# Patient Record
Sex: Male | Born: 1950 | Race: White | Hispanic: No | Marital: Married | Smoking: Current every day smoker
Health system: Southern US, Community
[De-identification: ages and names within clinical notes are randomized; demographics above are authoritative.]

## PROBLEM LIST (undated history)

## (undated) DIAGNOSIS — I10 Essential (primary) hypertension: Secondary | ICD-10-CM

## (undated) DIAGNOSIS — K859 Acute pancreatitis without necrosis or infection, unspecified: Secondary | ICD-10-CM

## (undated) DIAGNOSIS — R55 Syncope and collapse: Secondary | ICD-10-CM

## (undated) DIAGNOSIS — I480 Paroxysmal atrial fibrillation: Secondary | ICD-10-CM

## (undated) DIAGNOSIS — E785 Hyperlipidemia, unspecified: Secondary | ICD-10-CM

## (undated) HISTORY — DX: Hyperlipidemia, unspecified: E78.5

## (undated) HISTORY — PX: RADIOFREQUENCY ABLATION: SHX2290

## (undated) HISTORY — DX: Syncope and collapse: R55

## (undated) HISTORY — DX: Essential (primary) hypertension: I10

## (undated) HISTORY — DX: Paroxysmal atrial fibrillation: I48.0

---

## 1999-06-07 HISTORY — PX: PACEMAKER INSERTION: SHX728

## 1999-06-16 ENCOUNTER — Inpatient Hospital Stay (HOSPITAL_COMMUNITY): Admission: EM | Admit: 1999-06-16 | Discharge: 1999-06-17 | Payer: Self-pay | Admitting: Cardiology

## 1999-06-16 ENCOUNTER — Encounter: Payer: Self-pay | Admitting: Cardiology

## 1999-06-17 ENCOUNTER — Encounter: Payer: Self-pay | Admitting: Cardiology

## 1999-07-23 ENCOUNTER — Inpatient Hospital Stay (HOSPITAL_COMMUNITY): Admission: EM | Admit: 1999-07-23 | Discharge: 1999-07-27 | Payer: Self-pay | Admitting: Cardiology

## 1999-07-27 ENCOUNTER — Encounter: Payer: Self-pay | Admitting: Cardiology

## 2004-04-19 ENCOUNTER — Ambulatory Visit: Payer: Self-pay | Admitting: *Deleted

## 2004-05-05 ENCOUNTER — Ambulatory Visit: Payer: Self-pay

## 2004-09-06 ENCOUNTER — Ambulatory Visit: Payer: Self-pay | Admitting: *Deleted

## 2004-09-22 ENCOUNTER — Ambulatory Visit: Payer: Self-pay | Admitting: *Deleted

## 2004-10-20 ENCOUNTER — Ambulatory Visit: Payer: Self-pay | Admitting: *Deleted

## 2004-11-18 ENCOUNTER — Ambulatory Visit: Payer: Self-pay | Admitting: *Deleted

## 2004-12-29 ENCOUNTER — Ambulatory Visit: Payer: Self-pay | Admitting: *Deleted

## 2005-01-27 ENCOUNTER — Ambulatory Visit: Payer: Self-pay | Admitting: *Deleted

## 2005-02-11 ENCOUNTER — Ambulatory Visit: Payer: Self-pay | Admitting: *Deleted

## 2005-03-11 ENCOUNTER — Ambulatory Visit: Payer: Self-pay | Admitting: *Deleted

## 2005-03-28 ENCOUNTER — Ambulatory Visit (HOSPITAL_COMMUNITY): Admission: RE | Admit: 2005-03-28 | Discharge: 2005-03-28 | Payer: Self-pay | Admitting: Internal Medicine

## 2005-05-18 ENCOUNTER — Ambulatory Visit: Payer: Self-pay | Admitting: Internal Medicine

## 2005-05-25 ENCOUNTER — Ambulatory Visit: Payer: Self-pay | Admitting: Cardiology

## 2005-06-03 ENCOUNTER — Ambulatory Visit: Payer: Self-pay | Admitting: Cardiology

## 2005-06-09 ENCOUNTER — Ambulatory Visit: Payer: Self-pay | Admitting: Cardiology

## 2005-06-22 ENCOUNTER — Ambulatory Visit: Payer: Self-pay | Admitting: Internal Medicine

## 2005-06-22 ENCOUNTER — Ambulatory Visit (HOSPITAL_COMMUNITY): Admission: RE | Admit: 2005-06-22 | Discharge: 2005-06-23 | Payer: Self-pay | Admitting: Internal Medicine

## 2005-06-27 ENCOUNTER — Ambulatory Visit: Payer: Self-pay | Admitting: *Deleted

## 2005-06-29 ENCOUNTER — Ambulatory Visit: Payer: Self-pay | Admitting: *Deleted

## 2005-07-01 ENCOUNTER — Ambulatory Visit: Payer: Self-pay | Admitting: *Deleted

## 2005-07-18 ENCOUNTER — Ambulatory Visit: Payer: Self-pay | Admitting: *Deleted

## 2005-08-02 ENCOUNTER — Ambulatory Visit: Payer: Self-pay | Admitting: Internal Medicine

## 2005-08-30 ENCOUNTER — Ambulatory Visit: Payer: Self-pay | Admitting: Internal Medicine

## 2005-09-29 ENCOUNTER — Ambulatory Visit: Payer: Self-pay | Admitting: Internal Medicine

## 2005-11-02 ENCOUNTER — Ambulatory Visit: Payer: Self-pay | Admitting: Internal Medicine

## 2006-01-09 ENCOUNTER — Ambulatory Visit: Payer: Self-pay | Admitting: Internal Medicine

## 2006-02-03 ENCOUNTER — Ambulatory Visit: Payer: Self-pay | Admitting: Internal Medicine

## 2006-03-07 ENCOUNTER — Ambulatory Visit: Payer: Self-pay | Admitting: Internal Medicine

## 2006-04-04 ENCOUNTER — Ambulatory Visit: Payer: Self-pay | Admitting: Internal Medicine

## 2006-05-02 ENCOUNTER — Ambulatory Visit: Payer: Self-pay | Admitting: Internal Medicine

## 2006-06-02 ENCOUNTER — Ambulatory Visit: Payer: Self-pay | Admitting: Internal Medicine

## 2006-06-27 ENCOUNTER — Ambulatory Visit: Payer: Self-pay | Admitting: Internal Medicine

## 2006-08-22 ENCOUNTER — Ambulatory Visit: Payer: Self-pay | Admitting: Internal Medicine

## 2006-10-17 ENCOUNTER — Ambulatory Visit: Payer: Self-pay | Admitting: Internal Medicine

## 2006-12-19 ENCOUNTER — Ambulatory Visit: Payer: Self-pay | Admitting: Internal Medicine

## 2007-01-16 ENCOUNTER — Ambulatory Visit: Payer: Self-pay | Admitting: Internal Medicine

## 2007-02-13 ENCOUNTER — Ambulatory Visit: Payer: Self-pay | Admitting: Internal Medicine

## 2007-02-22 ENCOUNTER — Ambulatory Visit: Payer: Self-pay | Admitting: Internal Medicine

## 2007-03-13 ENCOUNTER — Ambulatory Visit: Payer: Self-pay | Admitting: Internal Medicine

## 2007-06-12 ENCOUNTER — Ambulatory Visit: Payer: Self-pay | Admitting: Internal Medicine

## 2007-07-03 ENCOUNTER — Ambulatory Visit: Payer: Self-pay | Admitting: Internal Medicine

## 2007-10-02 ENCOUNTER — Ambulatory Visit: Payer: Self-pay | Admitting: Internal Medicine

## 2008-01-01 ENCOUNTER — Ambulatory Visit: Payer: Self-pay | Admitting: Cardiology

## 2008-04-02 ENCOUNTER — Ambulatory Visit: Payer: Self-pay | Admitting: Internal Medicine

## 2008-04-09 ENCOUNTER — Ambulatory Visit: Payer: Self-pay | Admitting: Internal Medicine

## 2008-07-02 ENCOUNTER — Ambulatory Visit: Payer: Self-pay | Admitting: Internal Medicine

## 2008-09-11 ENCOUNTER — Encounter: Payer: Self-pay | Admitting: Internal Medicine

## 2008-09-25 ENCOUNTER — Ambulatory Visit: Payer: Self-pay | Admitting: Internal Medicine

## 2008-12-31 ENCOUNTER — Ambulatory Visit: Payer: Self-pay | Admitting: Internal Medicine

## 2009-04-01 ENCOUNTER — Ambulatory Visit: Payer: Self-pay | Admitting: Internal Medicine

## 2009-04-29 DIAGNOSIS — E785 Hyperlipidemia, unspecified: Secondary | ICD-10-CM

## 2009-04-29 DIAGNOSIS — E119 Type 2 diabetes mellitus without complications: Secondary | ICD-10-CM | POA: Insufficient documentation

## 2009-04-29 DIAGNOSIS — I4892 Unspecified atrial flutter: Secondary | ICD-10-CM

## 2009-04-29 DIAGNOSIS — I10 Essential (primary) hypertension: Secondary | ICD-10-CM

## 2009-04-29 DIAGNOSIS — R55 Syncope and collapse: Secondary | ICD-10-CM | POA: Insufficient documentation

## 2009-05-04 ENCOUNTER — Encounter (INDEPENDENT_AMBULATORY_CARE_PROVIDER_SITE_OTHER): Payer: Self-pay | Admitting: *Deleted

## 2009-05-04 ENCOUNTER — Ambulatory Visit: Payer: Self-pay | Admitting: Internal Medicine

## 2009-05-04 DIAGNOSIS — Z950 Presence of cardiac pacemaker: Secondary | ICD-10-CM

## 2009-05-06 ENCOUNTER — Ambulatory Visit: Payer: Self-pay | Admitting: Cardiology

## 2009-05-06 ENCOUNTER — Ambulatory Visit (HOSPITAL_COMMUNITY): Admission: RE | Admit: 2009-05-06 | Discharge: 2009-05-06 | Payer: Self-pay | Admitting: Internal Medicine

## 2009-05-06 ENCOUNTER — Encounter: Payer: Self-pay | Admitting: Internal Medicine

## 2009-05-06 ENCOUNTER — Encounter (INDEPENDENT_AMBULATORY_CARE_PROVIDER_SITE_OTHER): Payer: Self-pay

## 2009-05-06 LAB — CONVERTED CEMR LAB
BUN: 26 mg/dL — ABNORMAL HIGH (ref 6–23)
INR: 1.17 (ref <1.50)
Potassium: 3.4 meq/L — ABNORMAL LOW (ref 3.5–5.3)
Prothrombin Time: 14.8 s (ref 11.6–15.2)
Sodium: 139 meq/L (ref 135–145)

## 2009-05-07 ENCOUNTER — Encounter: Payer: Self-pay | Admitting: Cardiology

## 2009-05-07 LAB — CONVERTED CEMR LAB
Eosinophils Absolute: 0.1 10*3/uL (ref 0.0–0.7)
Lymphs Abs: 2.5 10*3/uL (ref 0.7–4.0)
MCV: 88.5 fL (ref 78.0–100.0)
Monocytes Relative: 11 % (ref 3–12)
Neutrophils Relative %: 57 % (ref 43–77)
RBC: 4.7 M/uL (ref 4.22–5.81)
WBC: 8 10*3/uL (ref 4.0–10.5)
aPTT: 28 s (ref 24–37)

## 2009-05-08 ENCOUNTER — Ambulatory Visit (HOSPITAL_COMMUNITY): Admission: RE | Admit: 2009-05-08 | Discharge: 2009-05-08 | Payer: Self-pay | Admitting: Internal Medicine

## 2009-05-08 ENCOUNTER — Ambulatory Visit: Payer: Self-pay | Admitting: Internal Medicine

## 2009-05-22 ENCOUNTER — Encounter: Payer: Self-pay | Admitting: Internal Medicine

## 2009-05-25 ENCOUNTER — Encounter: Payer: Self-pay | Admitting: Internal Medicine

## 2009-05-25 ENCOUNTER — Ambulatory Visit: Payer: Self-pay | Admitting: Cardiology

## 2009-08-03 ENCOUNTER — Ambulatory Visit: Payer: Self-pay | Admitting: Internal Medicine

## 2009-08-12 ENCOUNTER — Ambulatory Visit: Payer: Self-pay | Admitting: Internal Medicine

## 2010-01-04 ENCOUNTER — Ambulatory Visit (HOSPITAL_COMMUNITY): Admission: RE | Admit: 2010-01-04 | Discharge: 2010-01-04 | Payer: Self-pay | Admitting: Internal Medicine

## 2010-03-03 ENCOUNTER — Ambulatory Visit (HOSPITAL_COMMUNITY)
Admission: RE | Admit: 2010-03-03 | Discharge: 2010-03-03 | Payer: Self-pay | Source: Home / Self Care | Admitting: Internal Medicine

## 2010-05-14 ENCOUNTER — Encounter (INDEPENDENT_AMBULATORY_CARE_PROVIDER_SITE_OTHER): Payer: Self-pay | Admitting: *Deleted

## 2010-05-18 ENCOUNTER — Ambulatory Visit: Payer: Self-pay | Admitting: Internal Medicine

## 2010-05-21 ENCOUNTER — Ambulatory Visit (HOSPITAL_COMMUNITY)
Admission: RE | Admit: 2010-05-21 | Discharge: 2010-05-21 | Payer: Self-pay | Source: Home / Self Care | Attending: Internal Medicine | Admitting: Internal Medicine

## 2010-06-16 ENCOUNTER — Encounter (INDEPENDENT_AMBULATORY_CARE_PROVIDER_SITE_OTHER): Payer: Self-pay | Admitting: *Deleted

## 2010-06-26 ENCOUNTER — Encounter: Payer: Self-pay | Admitting: Internal Medicine

## 2010-07-08 NOTE — Letter (Signed)
Summary: Appointment - Reminder 2  [facility] [employer], Main Office  [address]   [place], [place] [address]   Phone: [phone]  Fax: [phone]     June 16, 2010 MRN: [id number]   ARSH FEUTZ [address] [place], [place]  [address]   Dear Mr. GEIMER,  Our records indicate that it is past time to schedule a follow-up appointment. Dr. Ladona Ridgel recommended that you follow up with [facility]. It is very important that we reach you to schedule this appointment. We look forward to participating in your health care needs. Please contact us at the number listed above at your earliest convenience to schedule your appointment.  If you are unable to make an appointment at this time, give [place] a call so we can update our records.     Sincerely,   Glass blower/designer

## 2010-07-08 NOTE — Assessment & Plan Note (Signed)
Summary: 3 MTH F/U PER CHECKOUT ON 05/25/09/TG      Allergies Added: NKDA  Visit Type:  Follow-up Primary Provider:  Osborne Casco  CC:  no complaints today.  History of Present Illness: Mr. Gabriel Sandoval returns today for followup.  He has a h/o CHB, and is s/p PPM.  He has a h/o atrial flutter and is s/p ablation.   He had been at [facility] with mode switch to VVI pacing.  He subsequently underwent PPM generator change and returns today for followup.  He denies peripheral edema or c/p.  No syncope. His dyspnea is much improved.  Current Medications (verified): 1)  Prinivil 20 Mg Tabs (Lisinopril) .... Take 1 Tab Daily 2)  Metoprolol Tartrate 50 Mg Tabs (Metoprolol Tartrate) .... Take 1 Tab Daily 3)  Chlorthalidone 25 Mg Tabs (Chlorthalidone) .... Take 1 Tab Daily 4)  Glipizide 10 Mg Tabs (Glipizide) .... Take 1 Tab Daily 5)  Lantus 100 Unit/ml Soln (Insulin Glargine) .... 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    Vital Signs:  Patient profile:   60 year old male Weight:      245 pounds Pulse rate:   76 / minute BP sitting:   102 / 60  (right arm)  Vitals Entered By: Dreama Saa, CNA (August 12, 2009 2:49 PM)  Physical Exam  General:  Well developed, well nourished,man in no acute distress.  HEENT: normal Neck: supple. No JVD. Carotids 2+ bilaterally no bruits Cor: RRR no rubs, gallops or murmur Lungs: CTA.  Well healed PPM incision. Ab: soft, nontender. nondistended. No HSM. Good bowel sounds Ext: warm. no cyanosis,  clubbing or edema Neuro: alert and oriented. Grossly nonfocal. affect pleasant    PPM Specifications Following MD:  Lewayne Bunting, MD     PPM Vendor:  Medtronic     PPM Model Number:  [id number]     PPM Serial Number:  [id number] PPM DOI:  05/08/2009     PPM Implanting MD:  Lewayne Bunting, MD  Lead 1    Location: AV     DOI: 06/16/1999     Model #: 1388TC     Serial #: [id number]     Status: active Lead 2    Location: RV     DOI: 06/16/1999     Model #: 1388TC     Serial #: [id number]     Status: active  Magnet Response Rate:  BOL 85 ERI  65  Indications:  complete heart block  Explantation Comments:  05/08/2009 Medtronic Sigma [id number] H explanted  PPM Follow Up Remote Check?  No Battery Voltage:  2.8 V     Battery Est. Longevity:  7.5 years     Pacer Dependent:  Yes       PPM Device Measurements Atrium  Amplitude: 2.8 mV, Impedance: 466 ohms, Threshold: 0.625 V at 0.4 msec Right Ventricle  Impedance: 384 ohms, Threshold: 1.25 V at 0.4 msec  Episodes MS Episodes:  1     Percent Mode Switch:  <0.1%     Coumadin:  No Ventricular High Rate:  1     Atrial Pacing:  23.6%  Ventricular Pacing:  99.9%  Parameters Mode:  DDDR     Lower Rate Limit:  60     Upper Rate Limit:  130 Paced AV Delay:  180     Sensed AV Delay:  150 Next Cardiology Appt Due:  02/04/2010 Tech Comments:  RV reprogrammed 2.5@0 .4.  Device function normal.  1 VHR episode 6 seconds.  ROV 12/11 with Dr. Ladona Ridgel in [facility]. Altha Harm, LPN  August 13, 7827 3:17 PM  MD Comments:  Agree with above.  Impression & Recommendations:  Problem # 1:  CARDIAC PACEMAKER IN SITU (ICD-V45.01) His device is working normally.  I will see him back for a check in 10 months.  Problem # 2:  HYPERTENSION (ICD-401.9) He notes that his blood pressure has been well controlled.  He is trying to reduce his sodium intake.  Continue current meds. His updated medication list for this problem includes:    Prinivil 20 Mg Tabs (Lisinopril) .Marland Kitchen... Take  1 tab daily    Metoprolol Tartrate 50 Mg Tabs (Metoprolol tartrate) .Marland Kitchen... Take 1 tab daily    Chlorthalidone 25 Mg Tabs (Chlorthalidone) .Marland Kitchen... Take 1 tab daily

## 2010-07-08 NOTE — Letter (Signed)
Summary: Appointment - Reminder 2  [facility] [address]   [place], [place] [address]   Phone: [phone]  Fax: [phone]     May 14, 2010 MRN: [id number]   AVIYON HOCEVAR [address] [place], [place]  [address]   Dear Mr. PRYCE,  Our records indicate that it is time to schedule a follow-up appointment.  Dr.  Ladona Ridgel       recommended that you follow up with [place] in   12.2011         . It is very important that we reach you to schedule this appointment. We look forward to participating in your health care needs. Please contact us at the number listed above at your earliest convenience to schedule your appointment.  If you are unable to make an appointment at this time, give [place] a call so we can update our records.     Sincerely,   Glass blower/designer

## 2010-07-08 NOTE — Cardiovascular Report (Signed)
Summary: Office Visit   Office Visit   Imported By: Roderic Ovens 06/11/2009 15:30:33  _____________________________________________________________________  External Attachment:    Type:   Image     Comment:   External Document

## 2010-08-02 ENCOUNTER — Ambulatory Visit (INDEPENDENT_AMBULATORY_CARE_PROVIDER_SITE_OTHER): Payer: Managed Care, Other (non HMO) | Admitting: Internal Medicine

## 2010-08-02 DIAGNOSIS — K861 Other chronic pancreatitis: Secondary | ICD-10-CM

## 2010-08-26 ENCOUNTER — Encounter (INDEPENDENT_AMBULATORY_CARE_PROVIDER_SITE_OTHER): Payer: Self-pay | Admitting: *Deleted

## 2010-09-02 NOTE — Letter (Signed)
Summary: Appointment - Reminder 2  [facility], Main Office  [address]   [place], [place] [address]   Phone: [phone]  Fax: [phone]     August 26, 2010 MRN: [id number]   Gabriel Sandoval [address] [place], [place]  [address]   Dear [name],  Our records indicate that it is past time to schedule a follow-up appointment. Dr. Ladona Ridgel recommended that you follow up with [place] in December 2011. It is very important that we reach you to schedule this appointment. We look forward to participating in your health care needs. Please contact us at the number listed above at your earliest convenience to schedule your appointment.  If you are unable to make an appointment at this time, give [place] a call so we can update our records.     Sincerely,   Glass blower/designer

## 2010-10-19 NOTE — Assessment & Plan Note (Signed)
[facility]                         ELECTROPHYSIOLOGY OFFICE NOTE   Gabriel, Sandoval                    MRN:          [id number]  DATE:04/09/2008                            DOB:          [birth year]    Gabriel Sandoval returns today for followup.  He is a very pleasant, middle-  aged male with a history of complete heart block status post pacemaker  insertion, history of hypertension which has been well controlled,  history of atrial flutter status post catheter ablation, who returns  today for followup.  In the last year, he has done well.  He is working  more than 40 hours a week.  He denies chest pain or shortness of breath.   His current medications include:  1. Lisinopril 20 a day.  2. Glipizide 5 a day.  3. Metoprolol 50 mg half a tablet twice daily.  4. Metformin 500 twice a day.   PHYSICAL EXAMINATION:  GENERAL:  He is a pleasant, well-appearing,  middle-aged man in no distress.  VITAL SIGNS:  Blood pressure today was 122/70, the pulse 74 and regular,  the respirations were 18, and weight was 265 pounds.  NECK:  No jugular venous distention.  LUNGS:  Clear bilaterally to auscultation.  No wheezes, rales, or  rhonchi are present.  CARDIAC:  Regular rate and rhythm.  Normal S1 and S2.  ABDOMEN:  Soft, nontender.  EXTREMITIES:  Trace peripheral edema bilaterally.   Interrogation of his pacemaker demonstrates a Medtronic Sigma, P-waves  were 2.8, there were no R-waves secondary to complete heart block, the  impedance 438 in the A and 405 in the V, the threshold 0.5 at 0.4 in the  atrium and 1 at 0.6 in the RV.  Battery voltage was 2.7 V.  Its  estimated longevity is approximately 1 year.  He is 44% A paced and 100%  V paced.   IMPRESSION:  1. Complete heart block.  2. Hypertension.  3. Atrial flutter status post ablation.   DISCUSSION:  Overall, Gabriel Sandoval is stable.  His pacemaker is working  normally.  He is approaching, but not  yet at [facility].  We will plan to see  the patient back in the office in 6 months for additional pacemaker  evaluation.     Doylene Canning. Ladona Ridgel, MD  Electronically Signed    GWT/MedQ  DD: 04/09/2008  DT: 04/10/2008  Job #: [id number]   cc:   Kingsley Callander. Ouida Sills, MD

## 2010-10-19 NOTE — Assessment & Plan Note (Signed)
[facility]                         ELECTROPHYSIOLOGY OFFICE NOTE   Gabriel Sandoval, Gabriel Sandoval                    MRN:          [id number]  DATE:02/22/2007                            DOB:          December 24, 1950    Gabriel Sandoval returns today for followup.  He is a very pleasant, middle-  age male with a history of multiple medical problems including complete  heart block, atrial flutter, status post ablation, history of pacemaker  insertion who returns today for followup.  He denies chest pain or  shortness of breath.  He has had no palpitations and otherwise has been  well.  He continues to work on a regular basis.  He denies smoking  cigarettes.   PHYSICAL EXAMINATION:  GENERAL:  He is a pleasant, middle-age man in no  distress.  VITAL SIGNS:  Blood pressure 150/80, pulse 75 and regular, respirations  18, weight 278 pounds.  NECK:  No jugular venous distention.  LUNGS:  Clear bilaterally to auscultation.  Now wheezes, rales or  rhonchi.  CARDIAC:  Regular rate and rhythm with normal S1, S2.  EXTREMITIES:  No edema.  ABDOMEN:  Soft and nontender.   MEDICATIONS:  1. Glipizide 5 a day.  2. Chlorthalidone 25 a day.  3. Metoprolol 25 twice daily.  4. Metformin 500 mg twice daily.   Interrogation of his pacemaker demonstrates a Medtronic Sigma with P  waves of greater than 2.  There are no R waves secondary to pacemaker  dependence.  The impedance 451 in the atrium, 419 in the ventricle.  Threshold in the ventricle 0.15.  Battery voltage was 2.72 V.  Today, we  turned his pulse width down to 0.6 in the ventricle with still a two  time safety margin.  His estimated battery longevity is 1-1/2 years.   IMPRESSION:  1. Complete heart block.  2. Status post pacemaker.  3. Hypertension.  4. Atrial flutter, status post ablation.   DISCUSSION:  Overall, Gabriel Sandoval is stable and his pacemaker is working  normally.  We will plan to see him back in the office  in 1 year.     Doylene Canning. Ladona Ridgel, MD  Electronically Signed   GWT/MedQ  DD: 02/22/2007  DT: 02/23/2007  Job #: [id number]

## 2010-10-22 NOTE — Op Note (Signed)
NAMEROBIE, Gabriel Sandoval NO.:  [id number]   MEDICAL RECORD NO.:  [id number]          PATIENT TYPE:  OIB   LOCATION:  2006                         FACILITY:  [facility]   PHYSICIAN:  Doylene Canning. Ladona Ridgel, M.D.  DATE OF BIRTH:  1951/02/20   DATE OF PROCEDURE:  06/22/2005  DATE OF DISCHARGE:                                 OPERATIVE REPORT   PROCEDURE PERFORMED:  Electrophysiology study with RF catheter ablation of  atrial flutter.   I. INTRODUCTION:  The patient is a 54-year male with a history of complete  heart block and a history of hypertension and syncope; status post permanent  pacemaker insertion back in 2001. He developed atrial flutter which has been  present since 2005. He is placed on Coumadin. He is now referred for  electrophysiologic study and catheter ablation of his atrial flutter.   II. PROCEDURE:  After informed consent was obtained,  the patient was taken  to the diagnostic CP lab in the fasting state. After the usual preparation  and draping, intravenous fentanyl and midazolam was given for sedation. A 6-  [place] hexapolar catheter was inserted percutaneously into the right jugular  vein and advanced into the coronary sinus. A 5-French quadripolar catheter  was inserted percutaneously in the right femoral vein and advanced to the  HIS bundle region. A 7-French 20-pole halo catheter was inserted  percutaneously into the right femoral vein and advanced to the right atrium.   After measurement of the basic intervals, mapping was carried out which  demonstrated typical counterclockwise tricuspid annular reentrant atrial  flutter. Two RF energy applications were delivered to the atrial flutter  isthmus resulting in the termination of atrial flutter and restoration of  sinus rhythm. Pacing was carried out in the coronary sinus. Isthmus  conduction was intermittently present. Ten RF energy applications were  delivered to the usual atrial flutter isthmus resulting  in the creation of  an isthmus block. Following this, two bonus RF energy applications were also  delivered. At this point, rapid ventricular pacing was carried out from the  RV apex at a pacing cycle length of 600 milliseconds, and stepwise decreased  down to 500 milliseconds where an AV Wenckebach was observed. During rapid  ventricular  pacing the atrial was activation was midline and decremental.   Next, programmed ventricular stimulation was carried out from the ablation  catheter in the right ventricle at a pacing cycle length of 600  milliseconds. The S1-S2 interval was stepwise decreased from 540  milliseconds down to 300 milliseconds where  the retrograde AV node RP was  observed. During programmed ventricular stimulation the atrial activation  was midline and decremental. Rapid atrial pacing was then carried out  demonstrating AV block.  Programmed atrial stimulation was also carried out  demonstrating AV block. At this point, the patient was observed for  approximately 30 minutes and had no recurrent atrial flutter isthmus  conduction, nor any inducible atrial flutter.  The catheter was then  removed; hemostasis was assured; and the patient was returned to his room in  satisfactory condition.   III. COMPLICATIONS:  There  were no immediate procedure complications.   IV. RESULTS:  A.  BASELINE ECG:  His Baseline ECG demonstrates atrial  flutter with ventricular pacing.  The QRS duration was 170 milliseconds.  B.  BASELINE INTERVALS:  Atrial flutter cycle length was 256 milliseconds.  There was no HV interval secondary to underlying completely heart block.  The QRS duration was 170 milliseconds.  C.  RAPID VENTRICULAR PACING:  Rapid ventricular pacing was carried out from  the right ventricle and step-wise decreased from 600 milliseconds down to  500 milliseconds where an AV Wenckebach was observed.  During rapid  ventricular pacing the atrial pacing was midline and  incremental.  D.  PROGRAMMED VENTRICULAR STIMULATION:  Programmed ventricular stimulation  was carried out from the right ventricular apex at a base drive cycle length  of 161 milliseconds.  The S1-S2 interval was stepwise decreased from 540  milliseconds down to 300 milliseconds where the retrograde AV node ARP was  observed.  During programmed ventricular stimulation and atrial activation  sequence was midline and decremental.  E.  RAPID ATRIAL PACING:  Rapid atrial pacing demonstrated AV Wenckebach wt  600 milliseconds.  F.  PROGRAMMED ATRIAL STIMULATION:  Programmed atrial stimulation  demonstrated an AV node ERP of greater than or equal to 600/540.  G.  ARRHYTHMIAS OBSERVED:  1.  Atrial flutter initiation present at the time of EP study. Duration was      sustained cycle length of 256 milliseconds.  Method of termination was      with RF ablation.      1.  MAPPING:  Mapping of the atrial flutter isthmus demonstrated a very          large atrial flutter isthmus with very large atrial electrograms in          the atrial flutter isthmus.          1.  RF ENERGY APPLICATION:  A total of 14 RF energy applications              were delivered. During the second RF energy application, atrial              flutter was terminated and sinus rhythm was restored. Ten              additional RF energy applications were delivered resulting in              isthmus block. Finally two bonus RF energy applications were              delivered.   V. CONCLUSION:  This study demonstrates successful electrophysiologic study  and RF catheter ablation of typical atrial flutter with a total of 14 RF  energy applications delivered to the usual atrial flutter isthmus resulting  in termination of atrial flutter, restoration of sinus rhythm, and creation  of bidirectional block in atrial flutter isthmus.           ______________________________  Doylene Canning. Ladona Ridgel, M.D.    GWT/MEDQ  D:  06/22/2005  T:   06/22/2005  Job:  [id number]   cc:   Kingsley Callander. Ouida Sills, MD  Fax: [phone]   Vida Roller, M.D.  Fax: [phone]

## 2010-10-22 NOTE — Discharge Summary (Signed)
[facility]  Patient:    Gabriel Sandoval, Gabriel Sandoval                      MRN: [id number] Adm. Date:  [id number] Disc. Date: [id number] Attending:  Mirian Mo Dictator:   Abelino Derrick, P.A.C. [facility] CC:         Thomas C. Wall, M.D. [facility]             Carylon Perches, M.D., [place], [place]                           Discharge Summary  DISCHARGE DIAGNOSES:  1. Chest pain, normal coronaries in 1994, with myocardial infarction ruled out by     enzymes this admission.  2. History of permanent transvenous demand pacemaker in January 2001.  3. Hypertension.  4. Non-insulin-dependent diabetes.  5. History of esophageal spasm.  HOSPITAL COURSE:  The patient is a 60 year old male followed by Dr. Ouida Sills and Dr. Daleen Squibb, with a history of third degree AV block, status post Medtronic DDD pacemaker, January 2000, by Dr. Juanda Chance.  He was admitted to [facility] for evaluation of chest pain.  He apparently has had a catheterization in 1994, which was normal per the patient.  He has been doing well since his pacemaker without presyncopal symptoms or tachy palpitations.  He presented July 23, 1999 with new onset f chest pressure while at work.  He says that he became short of breath and diaphoretic.  He apparently had a recent exercise echo last week by Dr. Dietrich Pates and was told this was okay.  He was admitted to telemetry for further evaluation. He underwent cardiac catheterization by Dr. Chales Abrahams, which revealed normal coronaries and normal LV function.  V/Q scan was obtained prior to discharge that showed no evidence of PE.  The patient was discharged late on the 20th and will follow up  with Dr. Carylon Perches.  LABORATORY:  CK-MB and troponins are negative.  Lipid profile shows a cholesterol 224, HDL 30, LDL not calculated.  TSH is 0.96.  EKG shows normal sinus rhythm.  DISCHARGE MEDICATIONS:  Aspirin 81 mg a day.  DISPOSITION:  The patient is discharged in  stable condition to home.  He will follow up with Dr. Ouida Sills. DD:  09/09/99 TD:  09/09/99 Job: [id number]

## 2010-10-22 NOTE — Cardiovascular Report (Signed)
[facility]  Patient:    Gabriel Sandoval, Gabriel Sandoval                      MRN: [id number] Proc. Date: 07/26/99 Adm. Date:  [id number] Attending:  Mirian Mo CC:         Veneda Melter, M.D. [facility]             Thomas C. Wall, M.D. [facility]             Carylon Perches, M.D.                        Cardiac Catheterization  PROCEDURES PERFORMED: 1. Left heart catheterization. 2. Left ventriculogram.  DIAGNOSES: 1. Trivial coronary artery disease by angiogram. 2. Low normal left ventricular systolic function.  INDICATIONS:  Gabriel Sandoval is a 60 year old white male with a history of hypertension as well as third-degree AV block with syncope resulting in the placement of a permanent pacemaker in January of 2001.  The patient has recently had chest pressure, mild shortness of breath and diaphoresis.  He underwent a stress test which was within normal limits.  However, he has had persistence of  discomfort despite medical management.  He presents now for left heart catheterization.  TECHNIQUE:  After informed consent was obtained, the patient was brought to the  cardiac catheterization lab where both groins were sterilely prepped and draped. Lidocaine 1% was used to infiltrate the right groin, and a 6 French sheath placed into the right femoral artery using the modified Seldinger technique.  A 6 [place] JR4 catheter was used to engage the left coronary artery and selective angiography performed in various projections using manual injections of contrast.  A 6 [place] JR4 catheter could not be successfully engaged in the right coronary artery and so the pigtail catheter was advanced into the left ventricle and the left ventriculogram performed using power injections of contrast.  An AL-2 catheter as then introduced and used to engage the right coronary artery, which had a superior and slightly anterior takeoff.  Selective angiography was performed using manual  injections of contrast.  At the termination of the case, the catheters and sheath were removed and manual pressure applied until adequate hemostasis was achieved.  The patient tolerated the procedure well and was transferred to the floor in stable condition.  FINDINGS: 1. Left main trunk:  A large caliber vessel, long.  This has luminal irregularities    only. 2. Left anterior descending:  This is a large caliber vessel that provides several    small diagonal branches in the mid section.  The LAD system has luminal    irregularities. 3. Left circumflex artery:  This is a large caliber vessel that provides two    marginal branches in its proximal segment, a third marginal branch and distal    section.  The left circumflex system has luminal irregularities. 4. Right coronary artery:  The right coronary artery is dominant.  This is a medium    caliber vessel that provides a small posterior descending artery in its    terminal segment.  The right coronary system tapers slightly in the mid section    as it provides an RV marginal branch.  Otherwise there are luminal    irregularities only.  LEFT VENTRICULOGRAM:  Mildly dilated end-systolic and end-diastolic dimensions.  Overall left ventricular function is low normal.  Ejection fraction approximately 50%.  No mitral regurgitation.  LV  equals 147/18, aortic was 147/85, LVEDP equals 24.  ASSESSMENT AND PLAN:  Gabriel Sandoval is a 60 year old white male with trivial coronary artery disease by angiogram and low normal left ventricular systolic function. At this point other causes of chest discomfort will be investigated. DD:  07/26/99 TD:  07/26/99 Job: [id number]

## 2010-10-22 NOTE — Discharge Summary (Signed)
NAMEAUSTON, Gabriel Sandoval NO.:  [id number]   MEDICAL RECORD NO.:  [id number]          PATIENT TYPE:  OIB   LOCATION:  2006                         FACILITY:  [facility]   PHYSICIAN:  Doylene Canning. Ladona Ridgel, M.D.  DATE OF BIRTH:  1950/07/15   DATE OF ADMISSION:  06/22/2005  DATE OF DISCHARGE:  06/23/2005                                 DISCHARGE SUMMARY   PRINCIPAL DIAGNOSES:  1.  Paroxysmal atrial flutter (Asymptomatic.)  2.  Subtherapeutic INR on presentation January 17.  3.  Discharging day one status post electrophysiology study and      radiofrequency catheter ablation of typical counter clockwise atrial      flutter by Dr. Lewayne Bunting.   SECONDARY DIAGNOSES:  1.  History of syncope in the setting of intermittent complete heart block      status post Medtronic Sigma permanent pacemaker in 2001.  2.  Hypertension.  3.  Dyslipidemia.  4.  Diabetes.   ALLERGIES:  NO KNOWN DRUG ALLERGIES.   PROCEDURES THIS HOSPITALIZATION:  June 22, 2005, electrophysiology study  and radiofrequency catheter ablation of typical counter clockwise atrial  flutter by Dr. Lewayne Bunting.   BRIEF HISTORY:  [name] is a 60 year old male.  He has known history of  atrial flutter.  He is totally asymptomatic for this.  He had been typically  scheduled for ablation in November of 2005, but he canceled that  appointment.  The patient is unaware when he is having paroxysm.  He has no  chest pain, no dyspnea, and no dizziness.  Occasionally, he feels dizzy, but  very mildly.  No syncope, no nausea or vomiting.  He presents today for  electrophysiology study and radiofrequency catheter ablation.  At the time  of presentation, he was in a regular rhythm.  Coumadin has been stopped for  the last three days and is now subtherapeutic.   HOSPITAL COURSE:  The patient presented electively, January 17, and  underwent electrophysiology study with a successful radiofrequency catheter  ablation on  atrial flutter.  The patient has had no post procedural  complications.  He did have mild oozing from the catheterization site, which  is now completely stopped.  His INR on the post procedure day #1 is at 1.2.  PT was 15.6.  The patient was given 12.5 mg of Coumadin on arrival on the  floor in the afternoon of January 17, and then an additional 5 mg at  midnight.  The patient is discharged today on:  1.  Lovenox bridging 120 mg injected subcutaneously every 12 hours.  2.  Also, to continue his Coumadin at 7.5 mg daily.  3.  Lisinopril 40 mg daily.  4.  Lopressor 25 mg twice daily.  5.  __________ 25 mg daily.  6.  Glipizide 5 mg daily.   He can shower.  He is to call [phone] if he experiences pain, swelling or  bleeding at the cath site.  He is asked not to drive for the next two days  and no lifting heavier than 10 pounds for the next two weeks.  He is to  follow up with [facility] [place] office Monday, January 22 at 2:45  for a Protime.  He sees Dr. Ladona Ridgel at [facility], [address] on February 27 at 10 o'clock in the morning.      Maple Mirza, P.A.    ______________________________  Doylene Canning. Ladona Ridgel, M.D.    GM/MEDQ  D:  06/23/2005  T:  06/23/2005  Job:  [id number]   cc:   [facility]   Kingsley Callander. Ouida Sills, MD  Fax: [phone]

## 2010-10-22 NOTE — Op Note (Signed)
[place]  Patient:    Gabriel Sandoval                       MRN: [id number] Proc. Date: 06/16/99 Adm. Date:  [id number] Attending:  Mirian Mo CC:         Carylon Perches, M.D.             Thomas C. Wall, M.D. [facility]             Bruce R. Juanda Chance, M.D. [facility]             Cardiopulmonary Lab                           Operative Report  CLINICAL HISTORY:  Mr. Betancur is 60 years old and had had recurrent syncopal spells over the last two weeks.  His electrocardiogram showed left bundle branch block. A monitor documented complete heart block, and he was hospitalized at [facility] and transferred here for a pacemaker insertion.  PROCEDURE:  Implantation of a Medtronic Sigma DVD generator (Model No. [id number], Serial No. [id number]), with a Pacesetter bipolar 58 cm screw-in ventricular lead (Model No. 1388PC, Serial No. [id number]), with a Pacesetter bipolar 52 cm atrial ead (Model No. 1388PC, Serial No. [id number]).  INDICATIONS:  Intermittent complete heart block with syncope.  ANESTHESIA:  Xylocaine 1%.  ESTIMATED BLOOD LOSS:  Less than 20 cc.  COMPLICATIONS:  None.  PROCEDURAL NOTE:  The procedure was performed in the laboratory room #4.  The right anterior chest was prepped and draped in the usual fashion.  The skin and subcutaneous tissue were anesthetized with 1% local Xylocaine.  Using an 18-gauge thin-walled needle, the subclavian vein was entered with a vertical approach over the first rib.  Access was obtained with a .038 wire.  An incision was made below the clavicle and extended through the prepectoral fascia.  A pocket was made inferior to the incision using blunt dissection.  Using two 10-French sheaths the atrial and ventricular leads were passed to the right atrium.  A figure-of-eight suture was placed at the entry for hemostasis and for later securing the leads.  The ventricular lead was positioned into the right  ventricular apex, with good pacing parameters described below.  The atrial lead was screwed in the right atrial appendage, with good pacing parameters as described below.  After removal of the stylet and the .038 wire, the figure-of-eight suture was secured at the entry site.  The leads were attached to the prepectoral fascia with two sutures of 1.0 silk  around each elastic collar.  The pocket was irrigated with sterile kanamycin solution.  The generator was attached to the leads with a hex nut wrench.  The generator was then implanted into the pocket and secured loosely to the prepectoral fascia with 1.0 silk.  The subcutaneous tissue was closed with running 2.0 Dexon. The skin was closed with running 5.0 Dexon.  PACING PARAMETERS: 1. Ventricular Unipolar:    R-wave 9.2 mV.  Minimum threshold capture 0.5 V.  Resistance 520 ohm. 2. Ventricular bipolar:    R-wave 17.2 mV.  Minimum threshold capture 0.5 V.  Resistance 587 ohm. 3. Atrial Unipolar:    P-wave 1.8 mV.  Minimum threshold capture 1.8 V.  Resistance 608 ohm. 4. Atrial Bipolar:    P-wave 4.6 mV.  Minimum threshold capture 1.4 V.  Resistance 576 ohm.  There was no  pacing diaphragm on either lead at 10 V, in either unipolar or bipolar mode.  The patient had quite a bit of back pain and restlessness during the procedure;  required 4 mg of Versed and 5 mg of morphine.  Otherwise he tolerated the procedure well and left the laboratory in satisfactory condition. DD:  06/16/99 TD:  06/17/99 Job: [id number]

## 2010-11-26 ENCOUNTER — Encounter: Payer: Self-pay | Admitting: *Deleted

## 2011-06-06 ENCOUNTER — Encounter: Payer: Self-pay | Admitting: Internal Medicine

## 2011-08-27 ENCOUNTER — Other Ambulatory Visit (INDEPENDENT_AMBULATORY_CARE_PROVIDER_SITE_OTHER): Payer: Self-pay | Admitting: Internal Medicine

## 2012-02-23 ENCOUNTER — Encounter (HOSPITAL_COMMUNITY): Payer: Self-pay | Admitting: *Deleted

## 2012-02-23 ENCOUNTER — Inpatient Hospital Stay (HOSPITAL_COMMUNITY)
Admission: EM | Admit: 2012-02-23 | Discharge: 2012-02-25 | DRG: 638 | Disposition: A | Discharge status: Home / Self Care | Payer: Self-pay | Attending: Internal Medicine | Admitting: Internal Medicine

## 2012-02-23 DIAGNOSIS — Z79899 Other long term (current) drug therapy: Secondary | ICD-10-CM

## 2012-02-23 DIAGNOSIS — R739 Hyperglycemia, unspecified: Secondary | ICD-10-CM

## 2012-02-23 DIAGNOSIS — E785 Hyperlipidemia, unspecified: Secondary | ICD-10-CM | POA: Diagnosis present

## 2012-02-23 DIAGNOSIS — E876 Hypokalemia: Secondary | ICD-10-CM | POA: Diagnosis present

## 2012-02-23 DIAGNOSIS — R000 Tachycardia, unspecified: Secondary | ICD-10-CM | POA: Diagnosis present

## 2012-02-23 DIAGNOSIS — IMO0001 Reserved for inherently not codable concepts without codable children: Principal | ICD-10-CM | POA: Diagnosis present

## 2012-02-23 DIAGNOSIS — Z87891 Personal history of nicotine dependence: Secondary | ICD-10-CM

## 2012-02-23 DIAGNOSIS — I4891 Unspecified atrial fibrillation: Secondary | ICD-10-CM | POA: Diagnosis present

## 2012-02-23 DIAGNOSIS — E871 Hypo-osmolality and hyponatremia: Secondary | ICD-10-CM | POA: Diagnosis present

## 2012-02-23 DIAGNOSIS — Z9119 Patient's noncompliance with other medical treatment and regimen: Secondary | ICD-10-CM

## 2012-02-23 DIAGNOSIS — Z91199 Patient's noncompliance with other medical treatment and regimen due to unspecified reason: Secondary | ICD-10-CM

## 2012-02-23 DIAGNOSIS — K861 Other chronic pancreatitis: Secondary | ICD-10-CM | POA: Diagnosis present

## 2012-02-23 DIAGNOSIS — Z794 Long term (current) use of insulin: Secondary | ICD-10-CM

## 2012-02-23 DIAGNOSIS — I10 Essential (primary) hypertension: Secondary | ICD-10-CM | POA: Diagnosis present

## 2012-02-23 DIAGNOSIS — Z950 Presence of cardiac pacemaker: Secondary | ICD-10-CM

## 2012-02-23 HISTORY — DX: Acute pancreatitis without necrosis or infection, unspecified: K85.90

## 2012-02-23 LAB — CBC WITH DIFFERENTIAL/PLATELET
Lymphocytes Relative: 15 % (ref 12–46)
Lymphs Abs: 1.9 10*3/uL (ref 0.7–4.0)
MCV: 81.9 fL (ref 78.0–100.0)
Neutro Abs: 10 10*3/uL — ABNORMAL HIGH (ref 1.7–7.7)
Neutrophils Relative %: 77 % (ref 43–77)
Platelets: 204 10*3/uL (ref 150–400)
RBC: 5.07 MIL/uL (ref 4.22–5.81)
WBC: 13 10*3/uL — ABNORMAL HIGH (ref 4.0–10.5)

## 2012-02-23 LAB — URINALYSIS, ROUTINE W REFLEX MICROSCOPIC
Bilirubin Urine: NEGATIVE
Glucose, UA: >1000 mg/dL — AB
Protein, ur: NEGATIVE mg/dL
Urobilinogen, UA: 0.2 mg/dL (ref 0.0–1.0)

## 2012-02-23 LAB — COMPREHENSIVE METABOLIC PANEL
ALT: 9 U/L (ref 0–53)
Alkaline Phosphatase: 99 U/L (ref 39–117)
CO2: 24 mEq/L (ref 19–32)
Chloride: 82 mEq/L — ABNORMAL LOW (ref 96–112)
GFR calc Af Amer: 79 mL/min — ABNORMAL LOW (ref >90)
GFR calc non Af Amer: 68 mL/min — ABNORMAL LOW (ref >90)
Glucose, Bld: 610 mg/dL (ref 70–99)
Potassium: 3.3 mEq/L — ABNORMAL LOW (ref 3.5–5.1)
Sodium: 127 mEq/L — ABNORMAL LOW (ref 135–145)

## 2012-02-23 LAB — GLUCOSE, CAPILLARY

## 2012-02-23 LAB — URINE MICROSCOPIC-ADD ON

## 2012-02-23 MED ORDER — INSULIN ASPART 100 UNIT/ML ~~LOC~~ SOLN
0.0000 [IU] | Freq: Every day | SUBCUTANEOUS | Status: DC
  Administered 2012-02-23: 8 [IU] via SUBCUTANEOUS
  Administered 2012-02-24: 3 [IU] via SUBCUTANEOUS
  Filled 2012-02-23: qty 1

## 2012-02-23 MED ORDER — SODIUM CHLORIDE 0.9 % IV BOLUS (SEPSIS)
1000.0000 mL | Freq: Once | INTRAVENOUS | Status: AC
  Administered 2012-02-23: 1000 mL via INTRAVENOUS

## 2012-02-23 MED ORDER — POTASSIUM CHLORIDE CRYS ER 20 MEQ PO TBCR
20.0000 meq | EXTENDED_RELEASE_TABLET | Freq: Three times a day (TID) | ORAL | Status: DC
  Administered 2012-02-23 – 2012-02-24 (×4): 20 meq via ORAL
  Filled 2012-02-23 (×4): qty 1

## 2012-02-23 MED ORDER — INSULIN GLARGINE 100 UNIT/ML ~~LOC~~ SOLN
30.0000 [IU] | Freq: Every day | SUBCUTANEOUS | Status: DC
  Administered 2012-02-23 – 2012-02-24 (×2): 30 [IU] via SUBCUTANEOUS

## 2012-02-23 MED ORDER — SODIUM CHLORIDE 0.9 % IV SOLN: Freq: Once | INTRAVENOUS | Status: DC

## 2012-02-23 MED ORDER — ONDANSETRON HCL 4 MG/2ML IJ SOLN: 4.0000 mg | Freq: Four times a day (QID) | INTRAMUSCULAR | Status: DC | PRN

## 2012-02-23 MED ORDER — ENOXAPARIN SODIUM 40 MG/0.4ML ~~LOC~~ SOLN
40.0000 mg | SUBCUTANEOUS | Status: DC
  Administered 2012-02-23 – 2012-02-24 (×2): 40 mg via SUBCUTANEOUS
  Filled 2012-02-23 (×2): qty 0.4

## 2012-02-23 MED ORDER — ALUM & MAG HYDROXIDE-SIMETH 200-200-20 MG/5ML PO SUSP: 30.0000 mL | Freq: Four times a day (QID) | ORAL | Status: DC | PRN

## 2012-02-23 MED ORDER — ACETAMINOPHEN 325 MG PO TABS: 650.0000 mg | ORAL_TABLET | Freq: Four times a day (QID) | ORAL | Status: DC | PRN

## 2012-02-23 MED ORDER — INSULIN ASPART 100 UNIT/ML ~~LOC~~ SOLN
0.0000 [IU] | Freq: Three times a day (TID) | SUBCUTANEOUS | Status: DC
  Administered 2012-02-24: 11 [IU] via SUBCUTANEOUS
  Administered 2012-02-24: 8 [IU] via SUBCUTANEOUS
  Administered 2012-02-24: 11 [IU] via SUBCUTANEOUS
  Administered 2012-02-25: 5 [IU] via SUBCUTANEOUS

## 2012-02-23 MED ORDER — SODIUM CHLORIDE 0.9 % IV SOLN
INTRAVENOUS | Status: DC
  Administered 2012-02-23: 1000 mL via INTRAVENOUS
  Administered 2012-02-24 – 2012-02-25 (×4): via INTRAVENOUS

## 2012-02-23 MED ORDER — ONDANSETRON HCL 4 MG PO TABS: 4.0000 mg | ORAL_TABLET | Freq: Four times a day (QID) | ORAL | Status: DC | PRN

## 2012-02-23 MED ORDER — ACETAMINOPHEN 650 MG RE SUPP: 650.0000 mg | Freq: Four times a day (QID) | RECTAL | Status: DC | PRN

## 2012-02-23 MED ORDER — SODIUM CHLORIDE 0.9 % IV SOLN
INTRAVENOUS | Status: DC
  Administered 2012-02-23: 5.4 [IU]/h via INTRAVENOUS
  Filled 2012-02-23: qty 1

## 2012-02-23 NOTE — ED Provider Notes (Signed)
History     CSN: [id number]  Arrival date & time 02/23/12  1619   First MD Initiated Contact with Patient 02/23/12 1629      Chief Complaint  Patient presents with  . Hyperglycemia    (Consider location/radiation/quality/duration/timing/severity/associated sxs/prior treatment) HPI Pt with history of DM ran out of his medications about 5 days ago, has had increasing weakness, dizziness and occasional vomiting since then. Has not had any test strips at home to test his sugar. He was seen in PCP office and sent to the ED for eval. Dr. Ouida Sills has put orders in as well. No pain, no fever. Increased urination and thirst.     Past Medical History  Diagnosis Date  . Hypertension   . Diabetes mellitus   . Dyslipidemia   . Syncope   . PAF (paroxysmal atrial fibrillation)   . Pancreatitis     Past Surgical History  Procedure Date  . Radiofrequency ablation   . Pacemaker insertion 2001    History reviewed. No pertinent family history.  History  Substance Use Topics  . Smoking status: Former Games developer  . Smokeless tobacco: Not on file  . Alcohol Use: No      Review of Systems All other systems reviewed and are negative except as noted in HPI.   Allergies  Review of patient's allergies indicates no known allergies.  Home Medications   Current Outpatient Rx  Name Route Sig Dispense Refill  . CHLORTHALIDONE 25 MG PO TABS Oral Take 25 mg by mouth daily.      Marland Kitchen GLIPIZIDE 10 MG PO TABS Oral Take 10 mg by mouth daily.      . INSULIN GLARGINE 100 UNIT/ML [place] SOLN Subcutaneous Inject 32 Units into the skin at bedtime.      Marland Kitchen LISINOPRIL 20 MG PO TABS Oral Take 20 mg by mouth daily.      Marland Kitchen METOPROLOL TARTRATE 50 MG PO TABS Oral Take 50 mg by mouth daily.      Marland Kitchen PANTOPRAZOLE SODIUM 40 MG PO TBEC  TAKE ONE TABLET BY MOUTH IN THE MORNING 30 tablet 5    BP 135/72  Pulse 115  Temp 98.7 F (37.1 C) (Oral)  Resp 20  Ht 6\' 1"  (1.854 m)  Wt 212 lb (96.163 kg)  BMI 27.97 kg/m2  SpO2  98%  Physical Exam  Nursing note and vitals reviewed. Constitutional: He is oriented to person, place, and time. He appears well-developed and well-nourished.  HENT:  Head: Normocephalic and atraumatic.       Dry mouth  Eyes: EOM are normal. Pupils are equal, round, and reactive to light.  Neck: Normal range of motion. Neck supple.  Cardiovascular: Normal heart sounds and intact distal pulses.        tachycardia  Pulmonary/Chest: Effort normal and breath sounds normal.  Abdominal: Bowel sounds are normal. He exhibits no distension. There is no tenderness.  Musculoskeletal: Normal range of motion. He exhibits no edema and no tenderness.  Neurological: He is alert and oriented to person, place, and time. He has normal strength. No cranial nerve deficit or sensory deficit.  Skin: Skin is warm and dry. No rash noted.  Psychiatric: He has a normal mood and affect.    ED Course  Procedures (including critical care time)  Labs Reviewed  CBC WITH DIFFERENTIAL - Abnormal; Notable for the following:    WBC 13.0 (*)     MCHC 36.9 (*)     Neutro Abs 10.0 (*)  All other components within normal limits  COMPREHENSIVE METABOLIC PANEL - Abnormal; Notable for the following:    Sodium 127 (*)     Potassium 3.3 (*)     Chloride 82 (*)     Glucose, Bld 610 (*)     BUN 36 (*)     Total Protein 8.4 (*)     Total Bilirubin 1.4 (*)     GFR calc non Af Amer 68 (*)     GFR calc Af Amer 79 (*)     All other components within normal limits  GLUCOSE, CAPILLARY - Abnormal; Notable for the following:    Glucose-Capillary >600 (*)     All other components within normal limits  URINALYSIS, ROUTINE W REFLEX MICROSCOPIC - Abnormal; Notable for the following:    Glucose, UA >1000 (*)     Hgb urine dipstick MODERATE (*)     Ketones, ur 15 (*)     All other components within normal limits  GLUCOSE, CAPILLARY - Abnormal; Notable for the following:    Glucose-Capillary 465 (*)     All other components  within normal limits  URINE MICROSCOPIC-ADD ON - Abnormal; Notable for the following:    Squamous Epithelial / LPF FEW (*)     All other components within normal limits  CBC  CREATININE, SERUM  BASIC METABOLIC PANEL   No results found.   No diagnosis found.    MDM  Seen in the ED by Dr. Ouida Sills who will admit for hydration and correction of hyperglycemia. No significant acidosis.        Charles B. Bernette Mayers, MD 02/23/12 [phone]

## 2012-02-23 NOTE — ED Notes (Signed)
Verbal order to give 8 units instead of 5 given from dr Janna Arch. Floor notified.

## 2012-02-23 NOTE — ED Notes (Signed)
CRITICAL VALUE ALERT  Critical value received:  Glucose 610  Date of notification:  02/23/2012  Time of notification: 1730  Critical value read back: yes  Nurse who received alert:  Garald Braver  MD notified (1st page): Bernette Mayers  Time of first page:  1737  MD notified (2nd page):  Time of second page:  Responding MD: sheldon  Time MD responded:  [phone]

## 2012-02-23 NOTE — ED Notes (Signed)
Attempted to call report. Was advised nurse receiving pt will call this nurse back for report. 

## 2012-02-23 NOTE — ED Notes (Signed)
Attempted to call report. No answer at this time.

## 2012-02-23 NOTE — ED Notes (Signed)
Pt states feels sluggish, been out of meds since Monday.

## 2012-02-23 NOTE — ED Notes (Signed)
Vomiting, cbg elevated. Feels dizzy,

## 2012-02-24 LAB — GLUCOSE, CAPILLARY
Glucose-Capillary: 311 mg/dL — ABNORMAL HIGH (ref 70–99)
Glucose-Capillary: 319 mg/dL — ABNORMAL HIGH (ref 70–99)

## 2012-02-24 LAB — POCT I-STAT, CHEM 8
Creatinine, Ser: 1.4 mg/dL — ABNORMAL HIGH (ref 0.50–1.35)
Hemoglobin: 15.6 g/dL (ref 13.0–17.0)
Sodium: 131 mEq/L — ABNORMAL LOW (ref 135–145)
TCO2: 25 mmol/L (ref 0–100)

## 2012-02-24 LAB — BASIC METABOLIC PANEL
BUN: 32 mg/dL — ABNORMAL HIGH (ref 6–23)
Creatinine, Ser: 0.93 mg/dL (ref 0.50–1.35)
GFR calc Af Amer: >90 mL/min (ref >90)
GFR calc non Af Amer: >90 mL/min (ref >90)

## 2012-02-24 MED ORDER — PANCRELIPASE (LIP-PROT-AMYL) 12000-38000 UNITS PO CPEP
2.0000 | ORAL_CAPSULE | Freq: Three times a day (TID) | ORAL | Status: DC
  Administered 2012-02-24 – 2012-02-25 (×4): 2 via ORAL
  Filled 2012-02-24 (×4): qty 2

## 2012-02-24 MED ORDER — GLUCERNA SHAKE PO LIQD
237.0000 mL | Freq: Every day | ORAL | Status: DC
  Administered 2012-02-24: 237 mL via ORAL

## 2012-02-24 NOTE — Care Management Note (Unsigned)
    Page 1 of 1   02/24/2012     11:47:13 AM   CARE MANAGEMENT NOTE 02/24/2012  Patient:  Gabriel Sandoval, Gabriel Sandoval   Account Number:  [id number]  Date Initiated:  02/24/2012  Documentation initiated by:  Sharrie Rothman  Subjective/Objective Assessment:   Pt admitted from home with hyperglycemia. Pt lives with wife and will return home at discharge. Pt has lost his job and has no insurance, cannot afford medications.     Action/Plan:   Financial counselor notified of pts self pay status. Pt encouraged to go to [facility] for assistance with insulin. Pt stated that he could afford his strips at [facility].   Anticipated DC Date:  02/27/2012   Anticipated DC Plan:  HOME/SELF CARE  In-house referral  Financial Counselor      DC Planning Services  CM consult      Choice offered to / List presented to:             Status of service:  In process, will continue to follow Medicare Important Message given?   (If response is "NO", the following Medicare IM given date fields will be blank) Date Medicare IM given:   Date Additional Medicare IM given:    Discharge Disposition:    Per UR Regulation:    If discussed at Long Length of Stay Meetings, dates discussed:    Comments:  02/24/12 1146 Arlyss Queen, RN BSN CM

## 2012-02-24 NOTE — Progress Notes (Signed)
INITIAL ADULT NUTRITION ASSESSMENT Date: 02/24/2012   Time: 1:53 PM Reason for Assessment: MST= 4  Pt meets criteria for severe malnutrition in the context of acute illness or injury as evidenced by pt consuming <50% energy needs x 5 days, 10.6% wt loss x 3 months.  INTERVENTION: Glucerna Shake po q HS, each supplement provides 220 kcal and 10 grams of protein.  ASSESSMENT: Male 61 y.o.  Dx: <principal problem not specified>  Hx:  Past Medical History  Diagnosis Date  . Hypertension   . Diabetes mellitus   . Dyslipidemia   . Syncope   . PAF (paroxysmal atrial fibrillation)   . Pancreatitis    Past Surgical History  Procedure Date  . Radiofrequency ablation   . Pacemaker insertion 2001   Related Meds:  Scheduled Meds:   . enoxaparin (LOVENOX) injection  40 mg Subcutaneous Q24H  . insulin aspart  0-15 Units Subcutaneous TID WC  . insulin aspart  0-5 Units Subcutaneous QHS  . insulin glargine  30 Units Subcutaneous QHS  . lipase/protease/amylase  2 capsule Oral TID WC  . potassium chloride  20 mEq Oral TID  . sodium chloride  1,000 mL Intravenous Once  . DISCONTD: sodium chloride   Intravenous Once  . DISCONTD: sodium chloride   Intravenous Once   Continuous Infusions:   . sodium chloride 150 mL/hr at 02/24/12 1025  . DISCONTD: insulin (NOVOLIN-R) infusion Stopped (02/23/12 1932)   PRN Meds:.acetaminophen, acetaminophen, alum & mag hydroxide-simeth, ondansetron (ZOFRAN) IV, ondansetron  Ht: 6\' 1"  (185.4 cm)  Wt: 211 lb (95.709 kg)  Ideal Wt:  79.9 kg % Ideal Wt: 115%  Usual Wt: 259# % Usual Wt: 81% Wt Readings from Last 10 Encounters:  02/23/12 211 lb (95.709 kg)  08/12/09 245 lb (111.131 kg)  05/04/09 259 lb (117.482 kg)    Body mass index is 27.84 kg/(m^2). Classified as overweight.   Food/Nutrition Related Hx: Pt identified on malnutrition risk report (score of 4), due to weight loss and eating poorly. Pt also with recent noncompliance with diabetes  self-monitoring measures, due to pt losing his job, having no insurance, and unable to afford meds. Pt has also been very withdrawn after losing job. Weight hx reviewed. Dr. Ouida Sills reports wt of 236# in June, which reflects a 25# (10.6%) wt loss x 3 months. Pt has also had a 34# (13.9%) wt loss x 6 months. Pt with hx of being unable to eat or drink x 5 days.  PO's good during admission (100%).   Labs:  CMP     Component Value Date/Time   NA 132* 02/24/2012 0447   K 3.0* 02/24/2012 0447   CL 94* 02/24/2012 0447   CO2 26 02/24/2012 0447   GLUCOSE 318* 02/24/2012 0447   BUN 32* 02/24/2012 0447   CREATININE 0.93 02/24/2012 0447   CALCIUM 9.1 02/24/2012 0447   PROT 8.4* 02/23/2012 1645   ALBUMIN 4.4 02/23/2012 1645   AST 9 02/23/2012 1645   ALT 9 02/23/2012 1645   ALKPHOS 99 02/23/2012 1645   BILITOT 1.4* 02/23/2012 1645   GFRNONAA >90 02/24/2012 0447   GFRAA >90 02/24/2012 0447   No results found for this basename: HGBA1C   Lab Results  Component Value Date   CREATININE 0.93 02/24/2012   Intake:   Intake/Output Summary (Last 24 hours) at 02/24/12 1433 Last data filed at 02/24/12 1233  Gross per 24 hour  Intake   1422 ml  Output    550 ml  Net  872 ml    Diet Order: Carb Control  Supplements/Tube Feeding: none at this time  IVF:    sodium chloride Last Rate: 150 mL/hr at 02/24/12 1025  DISCONTD: insulin (NOVOLIN-R) infusion Last Rate: Stopped (02/23/12 1932)    Estimated Nutritional Needs:   Kcal:2097-2288 kcals daily Protein:77-96 grams protein daily Fluid:2.1-2.3 L fluid daily  NUTRITION DIAGNOSIS: -Inadequate oral intake (NI-2.1).  Status: Ongoing  RELATED TO: decreased appetite, weight loss  AS EVIDENCE BY: unable to eat or drink x 5 days, 8.8% weight loss x 3 months.   MONITORING/EVALUATION(Goals): PO intake, labs, weight, changes in status. 1) Pt will maintain current weight of 211# 2) Pt will meet >75% of estimated energy needs  EDUCATION NEEDS: -Education not  appropriate at this time  Dietitian #: [phone]  DOCUMENTATION CODES Per approved criteria  -Severe malnutrition in the context of acute illness or injury    Melody Haver, RD, LDN 02/24/2012, 1:53 PM

## 2012-02-24 NOTE — Progress Notes (Signed)
UR Chart Review Completed  

## 2012-02-24 NOTE — Progress Notes (Signed)
Inpatient Diabetes Program Recommendations  AACE/ADA: New Consensus Statement on Inpatient Glycemic Control (2013)  Target Ranges:  Prepandial:   less than 140 mg/dL      Peak postprandial:   less than 180 mg/dL (1-2 hours)      Critically ill patients:  140 - 180 mg/dL   Results for Gabriel Sandoval, Gabriel Sandoval (MRN [id number]) as of 02/24/2012 09:33  Ref. Range 02/24/2012 07:47  Glucose-Capillary Latest Range: 70-99 mg/dL 409 (H)   Noted patient not taking any home medications for 5 days due to illness.  Hyperglycemic on admit.  Transitioned to SQ insulin last night.  Inpatient Diabetes Program Recommendations Insulin - Basal: Please increase Lantus as needed- Currently on Lantus 30 units QHS- Takes Lantus 80 units daily at home. Insulin - Meal Coverage: If PO intake improves, may need to add low dose meal coverage- Novolog 4 units tid with meals.  Note: Will follow. Ambrose Finland RN, MSN, CDE Diabetes Coordinator Inpatient Diabetes Program [phone]

## 2012-02-24 NOTE — H&P (Signed)
Gabriel Sandoval, MOGAN NO.:  [id number]  MEDICAL RECORD NO.:  [id number]  LOCATION:  IC07                          FACILITY:  [facility]  PHYSICIAN:  Kingsley Callander. Ouida Sills, MD       DATE OF BIRTH:  19-Nov-1950  DATE OF ADMISSION:  02/23/2012 DATE OF DISCHARGE:  LH                             HISTORY & PHYSICAL   CHIEF COMPLAINT:  Weakness.  HISTORY OF PRESENT ILLNESS:  This patient is a 61 year old white male, who presented to the office with his family with a primary complaint of generalized weakness.  The patient had been seen 4 days prior to admission after experiencing a four-day course of nausea and vomiting. Has a history of chronic pancreatitis.  Also, has a history of diabetes. His family reports that he has not been eating or drinking and has not been taking any medication for the past 5 days.  He was found to have a glucose of 541.  He was dehydrated appearing.  He was hospitalized with a glucose of 610.  He was not acidotic.  He had not experienced any sign of recent underlying infection.  He has recently withdrawn socially after losing his job.  PAST MEDICAL HISTORY: 1. Diabetes. 2. Status post pacemaker placement 2001 for complete heart block. 3. History of atrial flutter treated with catheter ablation in 2007. 4. Chronic pancreatitis. 5. Hypertension.  MEDICATIONS: 1. Lantus 80 units daily. 2. NovoLog 10 units b.i.d. 3. Lisinopril 20 mg daily. 4. Chlorthalidone 25 mg daily. 5. Metoprolol 50 mg b.i.d. 6. Pancrease t.i.d.  ALLERGIES:  None.  SOCIAL HISTORY:  He has been a long-term smoker.  Previously used alcohol but does not use alcohol now.  He does not use recreational drugs.  FAMILY HISTORY:  Remarkable for lung cancer in his father and diabetes in his mother.  REVIEW OF SYSTEMS:  He has lost weight dropping from 236 in June to 215. He denies polyuria or polydipsia.  He has not had fever, chills, syncope, chest pain, difficulty breathing, or  difficulty voiding.  He has not had hematemesis or rectal bleeding.  PHYSICAL EXAMINATION:  VITAL SIGNS:  Temperature 97.9, pulse initially 115, respirations 20, blood pressure 149/68. GENERAL:  Weak and ill appearing male. HEENT:  No scleral icterus.  Oropharynx appears dry. NECK:  Supple with no JVD or thyromegaly. LUNGS:  Clear. HEART:  Regular with no murmurs. ABDOMEN:  Soft and nontender.  No hepatosplenomegaly.  He has a pacemaker placed in the upper chest. EXTREMITIES:  No cyanosis, clubbing, or edema. NEURO:  No focal weakness. LYMPH NODES:  No cervical or supraclavicular enlargement. SKIN:  Warm and dry.  LABORATORY DATA:  Glucose 610.  Sodium 127, potassium 3.3, bicarb 24, BUN 36, creatinine 1.15, calcium 9.9. AST 9, ALT 9, bilirubin 1.4, albumin 4.4.  White count 13,000, hemoglobin 15.3, platelets 204,000. Urinalysis reveals greater than a 1000 glucose, is negative for protein, ketones of 15.  IMPRESSION/PLAN: 1. Poorly controlled diabetes due to recent noncompliance.  He was     started on IV insulin in the emergency room and has had gradual     improvement in his glucose.  He has been started on IV normal  saline.  He will be hospitalized for additional evaluation and     treatment. 2. Dehydration.  Treat with IV fluids. 3. Hypokalemia.  We will supplement with additional potassium doses. 4. Chronic pancreatitis with recent nausea and vomiting, now resolved. 5. Status post pacemaker placement, stable. 6. History of hypertension, hold antihypertensives for now.     Kingsley Callander. Ouida Sills, MD     ROF/MEDQ  D:  02/24/2012  T:  02/24/2012  Job:  [id number]

## 2012-02-24 NOTE — Progress Notes (Signed)
NAMEJULUIS, Gabriel Sandoval NO.:  [id number]  MEDICAL RECORD NO.:  [id number]  LOCATION:  IC07                          FACILITY:  [facility]  PHYSICIAN:  Kingsley Callander. Ouida Sills, MD       DATE OF BIRTH:  June 29, 1950  DATE OF PROCEDURE:  02/24/2012 DATE OF DISCHARGE:                                PROGRESS NOTE   [name] is feeling a little better this morning.  He denies any pain or any vomiting.  His glucose has improved to 318 overnight.  PHYSICAL EXAMINATION:  VITAL SIGNS:  Temperature is 97.9 with a pulse of 82, respirations 20, and blood pressure is 149/68. GENERAL:  He appears in no distress. LUNGS:  Clear. HEART:  Regular with no murmurs. ABDOMEN:  Soft, nondistended, and nontender with no hepatosplenomegaly. He appears depressed.  IMPRESSION/PLAN: 1. Uncontrolled diabetes, Lantus and NovoLog are being re-initiated.     His glucose is improving with hydration and insulin. 2. Hypokalemia.  Serum potassium is dropped from 3.3 to 3.0.  Continue     potassium supplementation. 3. Dehydration improved.  BUN and creatinine have improved from 36 and     1.15 to 32 and 0.93. 4. Hyponatremia improved from 127-132. 5. Chronic pancreatitis.  We will restart a diabetic diet with     pancreatic enzymes at meals.     Kingsley Callander. Ouida Sills, MD     ROF/MEDQ  D:  02/24/2012  T:  02/24/2012  Job:  [id number]

## 2012-02-25 LAB — GLUCOSE, CAPILLARY: Glucose-Capillary: 235 mg/dL — ABNORMAL HIGH (ref 70–99)

## 2012-02-25 LAB — BASIC METABOLIC PANEL
BUN: 17 mg/dL (ref 6–23)
CO2: 27 mEq/L (ref 19–32)
GFR calc non Af Amer: >90 mL/min (ref >90)
Glucose, Bld: 201 mg/dL — ABNORMAL HIGH (ref 70–99)
Potassium: 2.9 mEq/L — ABNORMAL LOW (ref 3.5–5.1)

## 2012-02-25 MED ORDER — INSULIN ASPART 100 UNIT/ML ~~LOC~~ SOLN: 10.0000 [IU] | Freq: Two times a day (BID) | SUBCUTANEOUS | Status: DC

## 2012-02-25 MED ORDER — POTASSIUM CHLORIDE ER 8 MEQ PO TBCR: 20.0000 meq | EXTENDED_RELEASE_TABLET | Freq: Three times a day (TID) | ORAL | Status: DC

## 2012-02-25 MED ORDER — POTASSIUM CHLORIDE CRYS ER 20 MEQ PO TBCR
40.0000 meq | EXTENDED_RELEASE_TABLET | Freq: Once | ORAL | Status: AC
  Administered 2012-02-25: 40 meq via ORAL
  Filled 2012-02-25: qty 2

## 2012-02-25 MED ORDER — INSULIN GLARGINE 100 UNIT/ML ~~LOC~~ SOLN: 40.0000 [IU] | Freq: Every day | SUBCUTANEOUS | Status: DC

## 2012-02-25 NOTE — Discharge Summary (Signed)
Gabriel Sandoval, Gabriel Sandoval NO.:  [id number]  MEDICAL RECORD NO.:  [id number]  LOCATION:  IC07                          FACILITY:  [facility]  PHYSICIAN:  Kingsley Callander. Ouida Sills, MD       DATE OF BIRTH:  Feb 07, 1951  DATE OF ADMISSION:  02/23/2012 DATE OF DISCHARGE:  09/21/2013LH                              DISCHARGE SUMMARY   DISCHARGE DIAGNOSES: 1. Uncontrolled diabetes. 2. Hypokalemia. 3. History of complete heart block with pacemaker implantation. 4. History of atrial flutter, treated with ablation. 5. Dehydration.  DISCHARGE MEDICATIONS: 1. Lantus 40 units daily. 2. NovoLog 10 units b.i.d. 3. Potassium 20 mEq t.i.d. for 4 days.  HOSPITAL COURSE:  This patient is a 61 year old male who presented with generalized weakness.  He was found to have a markedly elevated glucose of 610.  He was markedly dehydrated with a BUN and creatinine of 36 and 1.15.  He was treated initially with IV insulin.  He was treated with IV fluids.  He had gradual improvement in his glucose.  Glucose on the morning of discharge is 201.  He has shown no evidence of ketoacidosis. His dehydration, resolved.  His BUN and creatinine improved to 17 and 0.78.  He continued to exhibit low potassium, so will be treated with additional potassium orally.  The patient has been able to eat without difficulty.  He previously had difficulty with nausea and vomiting one week prior to admission, although that had resolved by the time of admission.  Chlorthalidone, metoprolol and lisinopril are being held for now.  The patient will be seen in followup in the office in 1 week.  He will check Accu-Cheks at home and follow up with his log.  Compliance has been stressed.    Kingsley Callander. Ouida Sills, MD    ROF/MEDQ  D:  02/25/2012  T:  02/25/2012  Job:  [id number]

## 2012-02-25 NOTE — Progress Notes (Signed)
Discharge Summary: a/o.vss. Saline lock removed. Up ad lib. Discharge instructions given. Prescriptions given. Pt verbalized understanding of instructions. Awaiting for family to arrive for discharge to be transported home.

## 2012-03-12 ENCOUNTER — Encounter (INDEPENDENT_AMBULATORY_CARE_PROVIDER_SITE_OTHER): Payer: Self-pay | Admitting: *Deleted

## 2012-03-13 ENCOUNTER — Encounter (INDEPENDENT_AMBULATORY_CARE_PROVIDER_SITE_OTHER): Payer: Self-pay | Admitting: Internal Medicine

## 2012-03-13 ENCOUNTER — Ambulatory Visit (INDEPENDENT_AMBULATORY_CARE_PROVIDER_SITE_OTHER): Payer: Self-pay | Admitting: Internal Medicine

## 2012-03-13 VITALS — BP 130/80 | HR 82 | Temp 98.1°F | Resp 20 | Ht 73.0 in | Wt 230.3 lb

## 2012-03-13 DIAGNOSIS — K219 Gastro-esophageal reflux disease without esophagitis: Secondary | ICD-10-CM

## 2012-03-13 DIAGNOSIS — K861 Other chronic pancreatitis: Secondary | ICD-10-CM | POA: Insufficient documentation

## 2012-03-13 DIAGNOSIS — R1013 Epigastric pain: Secondary | ICD-10-CM

## 2012-03-13 MED ORDER — PANTOPRAZOLE SODIUM 40 MG PO TBEC: 40.0000 mg | DELAYED_RELEASE_TABLET | Freq: Every day | ORAL | Status: DC

## 2012-03-13 NOTE — Progress Notes (Signed)
Presenting complaint;  Followup for chronic pancreatitis.  Subjective:  Patient is 61 year old Caucasian male has history of chronic calcific pancreatitis. I initially saw him in November 2011. Prior to that he been evaluated at [facility]. There was question of a mass. He underwent 2 CT scans in 2 EUSs with FNA and was negative. When I initially saw him in November 2012 he was begun on PPI and pancreatic enzyme supplement. His CA 19-9 was normal at 19.2 and IgG subclass 4 was normal. On followup visit in 08/24/2010 he was asymptomatic. He was advised to continue his medication with plans for follow visit in July 2012 he did not return for followup visit. He stopped pancreatic enzyme supplement 7 months ago on his own has continued to pantoprazole. About 4 weeks ago had an episode of epigastric pain associated with nausea and vomiting. He was admitted to [facility] on 02/23/2012 by Dr. Ouida Sills for glucose over 600. He had nausea and vomiting but no pain. Prior to that visit he another episode of nausea vomiting all night after he ate Turner greens with vinegar and he also had mild epigastric pain. He has not experienced any nausea vomiting or abdominal pain since discharge over 2 weeks ago. He is having 1 formed stool daily he denies melena or rectal bleeding. He has gained 8 pounds since his last visit of February 2012. He is presently unemployed. He hasn't had any alcohol in over 2 years. He quit cigarette smoking one month ago after having smoked for 30 years. Past medical history; Hypertension. Diabetes mellitus. Chronic calcific pancreatitis as above. Had pacemaker placed in 1992 and redo in 2010 for heart block. Treated for cat scratch fever in 1996    Current Medications: Current Outpatient Prescriptions  Medication Sig Dispense Refill  . aspirin 81 MG tablet Take 81 mg by mouth daily.      . insulin aspart (NOVOLOG) 100 UNIT/ML injection Inject 10 Units into the skin 2 (two) times  daily with a meal.  1 vial  12  . insulin glargine (LANTUS) 100 UNIT/ML injection Inject 40 Units into the skin at bedtime.  10 mL  12  . lisinopril (PRINIVIL,ZESTRIL) 20 MG tablet Take 20 mg by mouth daily.      . pantoprazole (PROTONIX) 40 MG tablet Take 40 mg by mouth daily.      . potassium chloride (KLOR-CON) 8 MEQ tablet Take 2.5 tablets (20 mEq total) by mouth 3 (three) times daily.  12 tablet  0  . promethazine (PHENERGAN) 25 MG tablet Take 25 mg by mouth every 6 (six) hours as needed. nausea         Objective: Blood pressure 130/80, pulse 82, temperature 98.1 F (36.7 C), temperature source Oral, resp. rate 20, height 6\' 1"  (1.854 m), weight 230 lb 4.8 oz (104.463 kg). Patient is alert and in no acute distress. Conjunctiva is pink. Sclera is nonicteric Oropharyngeal mucosa is normal. He has 4 teeth remaining in poor condition 1 and lower jaw and 3 an upper. No neck masses or thyromegaly noted. Cardiac exam with regular rhythm normal S1 and S2. No murmur or gallop noted. Lungs are clear to auscultation. Abdomen is symmetrical. Bowel sounds are normal. On palpation soft abdomen without tenderness or organomegaly. Some fullness noted in epigastric region.  No LE edema or clubbing noted.  Labs/studies Results: Lab from 02/23/2012. AST 9 ALT 9 albumin 4.4.  Assessment:  Patient has history of chronic calcific pancreatitis secondary to prior alcohol use was at  2 episodes of epigastric pain over the last month. He is pain-free today and it abdominal exam is benign. He has gained 8 pounds since I last saw him in February 2012 which is very reassuring. He could be having spontaneous episodes of acute pancreatitis and top of chronic disease. He is also at risk for pancreatic carcinoma. He does not have any symptoms suggestive of steatorrhea. His condition needs to be evaluated with CT both for diagnostic and screening purposes. GERD. He is doing well with PPI.   Plan:  Continue  pantoprazole at 40 mg by mouth every morning. New prescription sent to his pharmacy. Abdominopelvic CT with contrast. Patient also screening colonoscopy but he would like to wait until next year. Office visit in 6 months.

## 2012-03-13 NOTE — Patient Instructions (Signed)
Notify if you have another episode of epigastric pain. Abdominopelvic CT to be scheduled.

## 2012-03-19 ENCOUNTER — Ambulatory Visit (HOSPITAL_COMMUNITY)
Admission: RE | Admit: 2012-03-19 | Discharge: 2012-03-19 | Disposition: A | Discharge status: Home / Self Care | Payer: Self-pay | Source: Ambulatory Visit | Attending: Internal Medicine | Admitting: Internal Medicine

## 2012-03-19 DIAGNOSIS — R1013 Epigastric pain: Secondary | ICD-10-CM | POA: Insufficient documentation

## 2012-03-19 DIAGNOSIS — K861 Other chronic pancreatitis: Secondary | ICD-10-CM | POA: Insufficient documentation

## 2012-03-19 MED ORDER — IOHEXOL 300 MG/ML  SOLN
100.0000 mL | Freq: Once | INTRAMUSCULAR | Status: AC | PRN
  Administered 2012-03-19: 100 mL via INTRAVENOUS

## 2012-06-20 ENCOUNTER — Ambulatory Visit: Payer: Self-pay | Admitting: Cardiology

## 2012-08-06 ENCOUNTER — Encounter: Payer: Self-pay | Admitting: Internal Medicine

## 2012-09-06 ENCOUNTER — Encounter (INDEPENDENT_AMBULATORY_CARE_PROVIDER_SITE_OTHER): Payer: Self-pay | Admitting: *Deleted

## 2012-11-05 ENCOUNTER — Ambulatory Visit (INDEPENDENT_AMBULATORY_CARE_PROVIDER_SITE_OTHER): Payer: Self-pay | Admitting: Internal Medicine

## 2013-05-01 IMAGING — CT CT ABD-PELV W/ CM
2 of 4 series · 16 of 46 positions shown, 18 images · IV contrast (Omnipaque 300)
Comparison: 03/03/2010

CLINICAL DATA: Epigastric pain, chronic calcific pancreatitis

CT ABDOMEN AND PELVIS WITH CONTRAST
TECHNIQUE: Multidetector CT imaging of the abdomen and pelvis was
performed following the standard protocol during bolus
administration of intravenous contrast.
Contrast: 100mL OMNIPAQUE IOHEXOL 300 MG/ML  SOLN

[Series 2: abd_pel_with 5.0 b40f · axial · 0.90mm/px · z∈[+572,+996]mm · 13 of 95 slices shown, 15 images]
[im 5/95  soft-tissue]
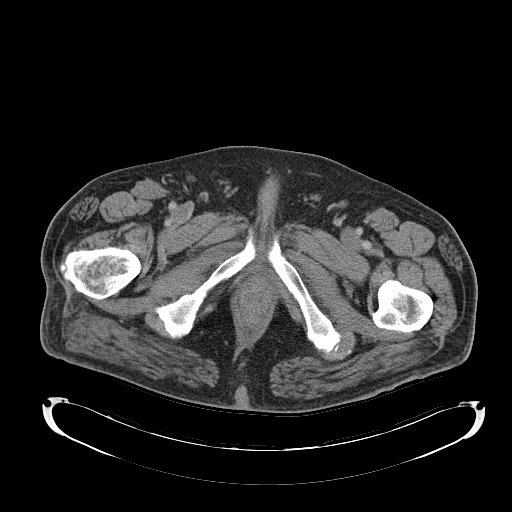
[im 5/95  bone]
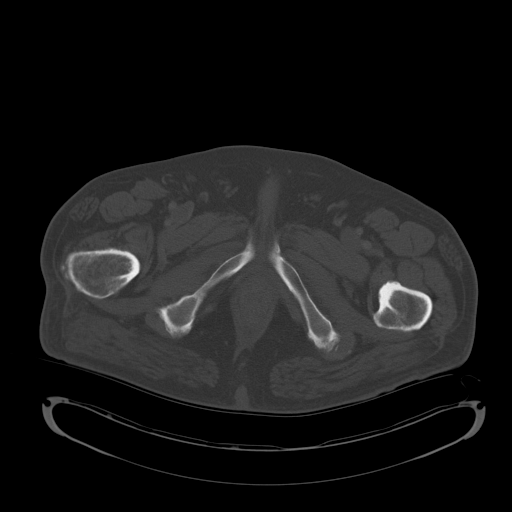
[im 15/95  soft-tissue]
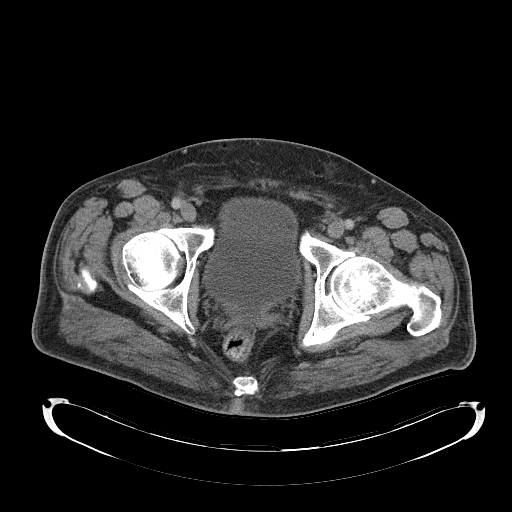
[im 20/95  soft-tissue]
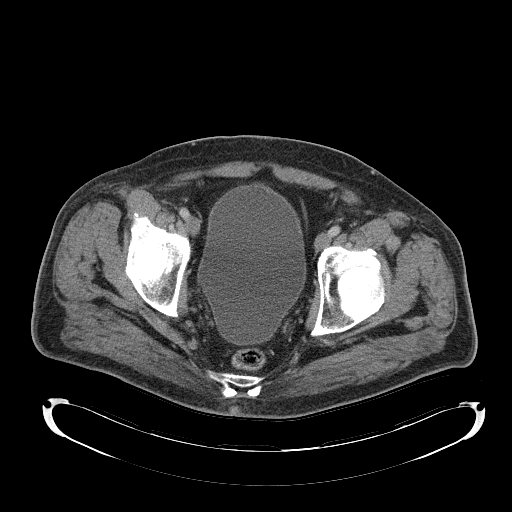
[im 25/95  soft-tissue]
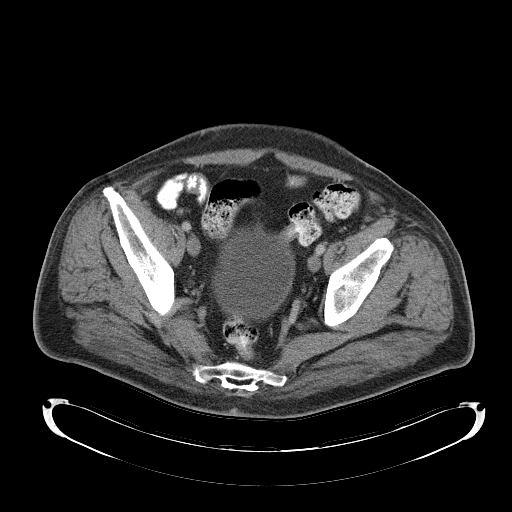
[im 35/95  soft-tissue]
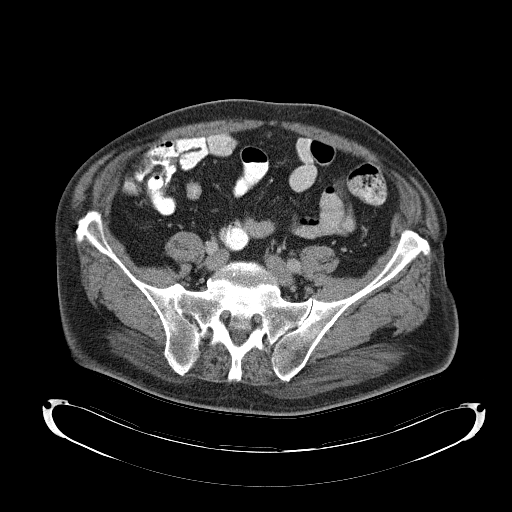
[im 40/95  soft-tissue]
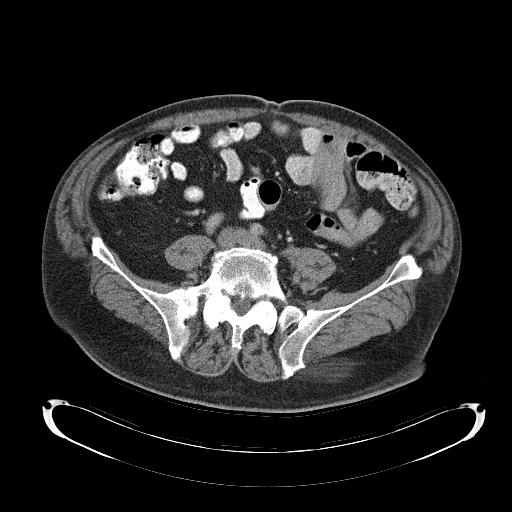
[im 50/95  soft-tissue]
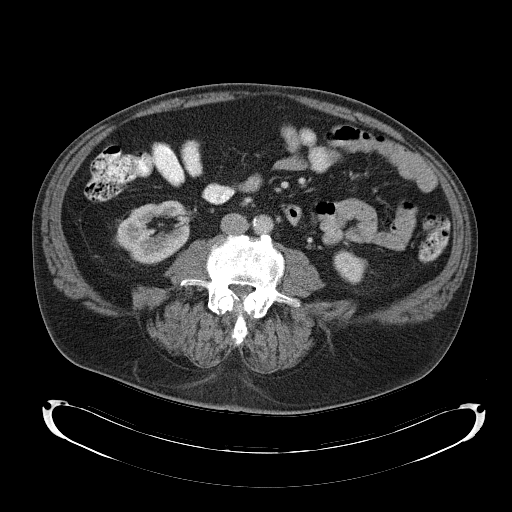
[im 55/95  soft-tissue]
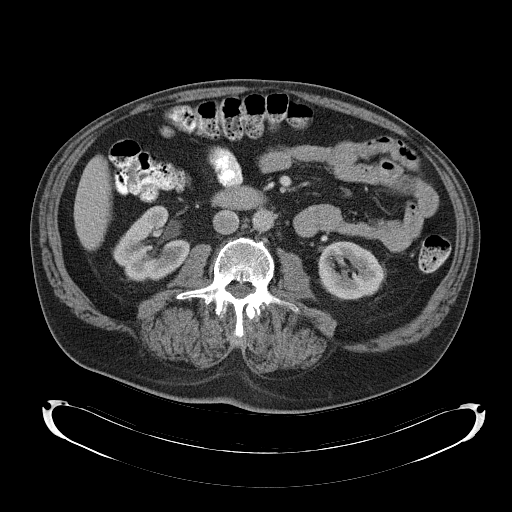
[im 60/95  soft-tissue]
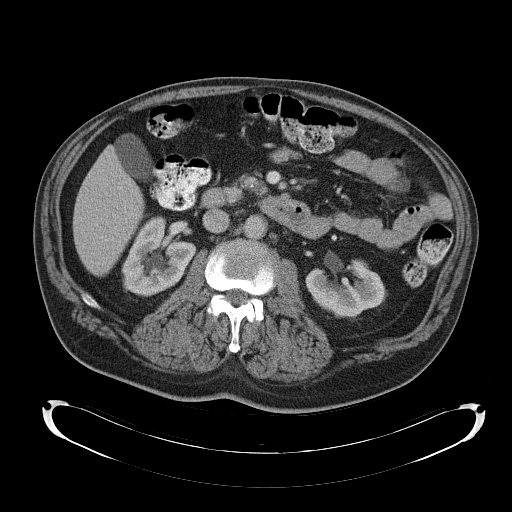
[im 60/95  bone]
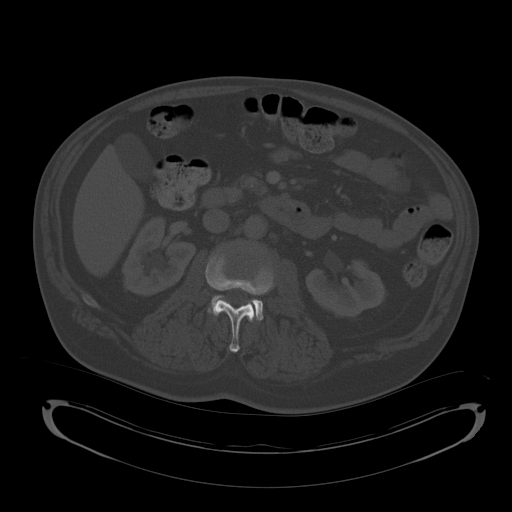
[im 70/95  soft-tissue]
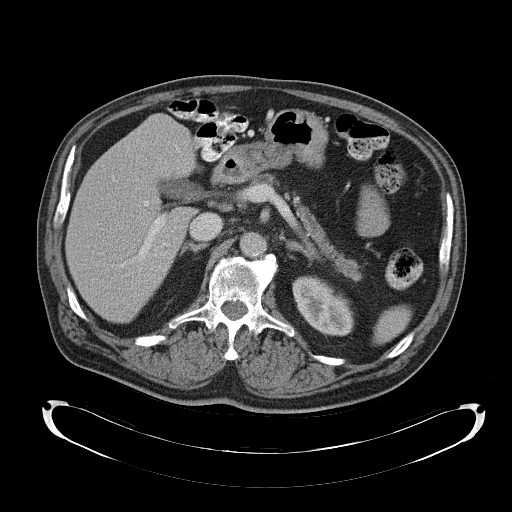
[im 75/95  soft-tissue]
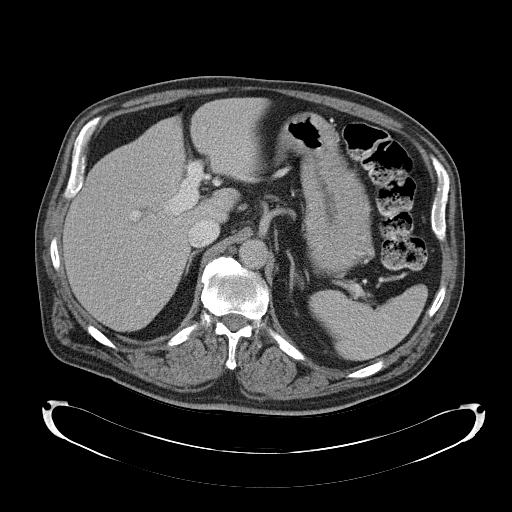
[im 80/95  soft-tissue]
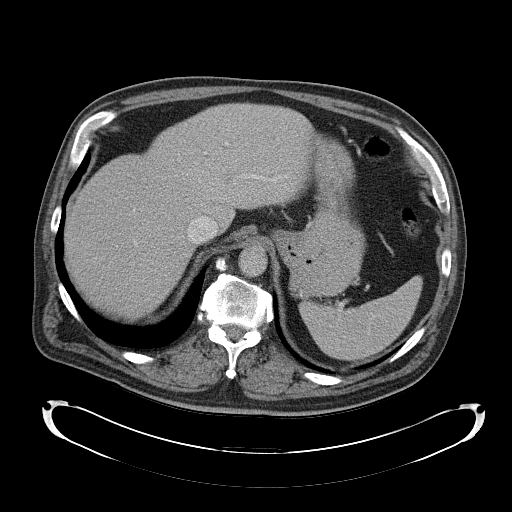
[im 90/95  soft-tissue]
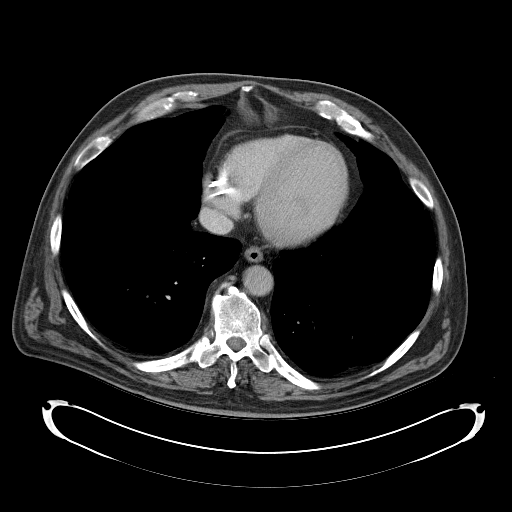

[Series 4: abd_pel_with 3.0 spo cor · coronal · 0.87mm/px · 3 of 88 slices shown]
[im 30/88  soft-tissue]
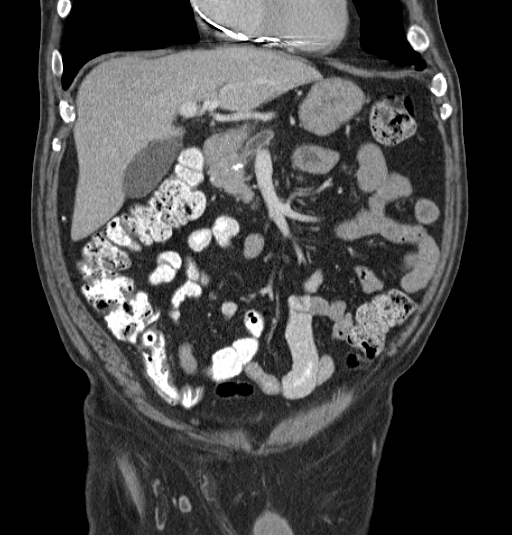
[im 39/88  soft-tissue]
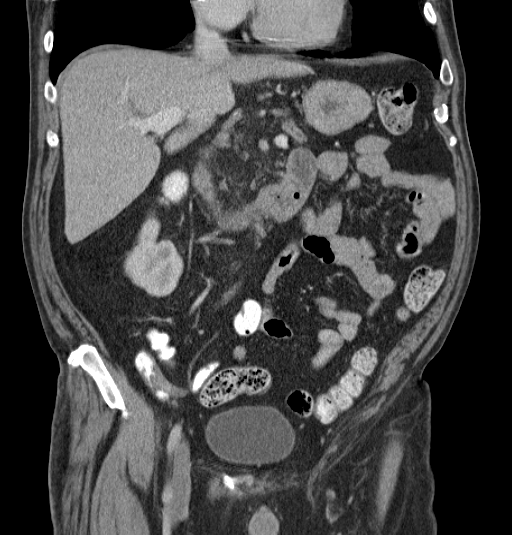
[im 49/88  soft-tissue]
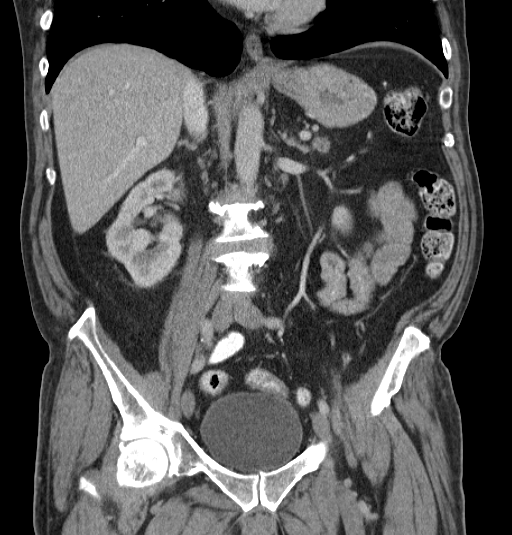

[16 of 46 positions shown; findings below may reference images not displayed]

FINDINGS: The lung bases appear clear.  No pericardial or pleural
effusion identified.

There is no focal liver abnormality.  Gallbladder appears normal.
No biliary dilatation.  Calcifications within the head and tail of
pancreas are identified consistent with chronic pancreatitis.
There is atrophy of the body and tail of pancreas with increased
caliber of the pancreatic duct.  The duct measures up to 5.8 mm,
image 24.  There are no specific features to suggest acute
pancreatitis.  No free fluid, fluid collections or evidence of
pseudocyst formation identified.  The spleen appears within normal
limits.

Both of the adrenal glands are normal.  Normal appearance of the
left kidney.  There is a 2.5 cm cyst arising from the posterior
cortex of the right kidney, image 39.  Urinary bladder appears
within normal limits.  Calcification is identified within the
prostate gland.  Prostate gland is slightly enlarged measuring
cm in transverse dimension, image 85.

No adenopathy within the upper abdomen.  There is no pelvic or
inguinal adenopathy.  No free fluid or fluid collections within the
abdomen or pelvis.

The stomach appears normal.  The small bowel loops have a normal
course and caliber.  Normal appearance of the colon.

Mild degenerative disc disease is noted within the lumbar spine.
IMPRESSION: 1.  No acute findings within the abdomen or pelvis.
2.  Changes consistent with chronic pancreatitis.

## 2014-03-14 ENCOUNTER — Encounter: Payer: Self-pay | Admitting: Internal Medicine

## 2014-05-08 ENCOUNTER — Ambulatory Visit (INDEPENDENT_AMBULATORY_CARE_PROVIDER_SITE_OTHER): Payer: Self-pay | Admitting: Internal Medicine

## 2014-05-08 ENCOUNTER — Encounter: Payer: Self-pay | Admitting: Internal Medicine

## 2014-05-08 VITALS — BP 162/74 | HR 61 | Ht 73.0 in | Wt 208.0 lb

## 2014-05-08 DIAGNOSIS — Z950 Presence of cardiac pacemaker: Secondary | ICD-10-CM

## 2014-05-08 DIAGNOSIS — I442 Atrioventricular block, complete: Secondary | ICD-10-CM

## 2014-05-08 DIAGNOSIS — I10 Essential (primary) hypertension: Secondary | ICD-10-CM

## 2014-05-08 LAB — MDC_IDC_ENUM_SESS_TYPE_INCLINIC
Battery Remaining Longevity: 65 mo
Battery Voltage: 2.78 V
Brady Statistic AP VP Percent: 56 %
Lead Channel Impedance Value: 388 Ohm
Lead Channel Impedance Value: 428 Ohm
Lead Channel Sensing Intrinsic Amplitude: 2 mV
Lead Channel Setting Pacing Amplitude: 2 V
Lead Channel Setting Pacing Pulse Width: 0.76 ms
Lead Channel Setting Sensing Sensitivity: 2 mV
MDC IDC MSMT BATTERY IMPEDANCE: 504 Ohm
MDC IDC MSMT LEADCHNL RA PACING THRESHOLD AMPLITUDE: 0.5 V
MDC IDC MSMT LEADCHNL RA PACING THRESHOLD PULSEWIDTH: 0.4 ms
MDC IDC MSMT LEADCHNL RV PACING THRESHOLD AMPLITUDE: 1 V
MDC IDC MSMT LEADCHNL RV PACING THRESHOLD PULSEWIDTH: 0.76 ms
MDC IDC SESS DTM: 20151203113214
MDC IDC SET LEADCHNL RV PACING AMPLITUDE: 2.5 V
MDC IDC STAT BRADY AP VS PERCENT: 0 %
MDC IDC STAT BRADY AS VP PERCENT: 44 %
MDC IDC STAT BRADY AS VS PERCENT: 0 %

## 2014-05-08 NOTE — Progress Notes (Signed)
      HPI Mr. Gabriel Sandoval returns after a 2 year absence from our arrhythmia clinic. He is a pleasant 63 yo man with CHB, s/p PPM insertion, HTN, DM and ongoing tobacco abuse. He denies chest pain or sob or edema. He has not had syncope. He notes that his blood pressure at home has been good. No Known Allergies   Current Outpatient Prescriptions  Medication Sig Dispense Refill  . aspirin 81 MG tablet Take 81 mg by mouth daily.    . insulin aspart (NOVOLOG) 100 UNIT/ML injection Inject 10 Units into the skin 2 (two) times daily with a meal. (Patient taking differently: Inject 10 Units into the skin as needed. ) 1 vial 12  . insulin glargine (LANTUS) 100 UNIT/ML injection Inject 40 Units into the skin at bedtime. (Patient taking differently: Inject 35 Units into the skin at bedtime. Takes 35 units twice each day.) 10 mL 12  . olmesartan (BENICAR) 20 MG tablet Take 20 mg by mouth daily.    . SitaGLIPtin-MetFORMIN HCl 50-500 MG TB24 Take by mouth. Take one tablet twice each day.    . varenicline (CHANTIX) 1 MG tablet Take 1 mg by mouth 2 (two) times daily.     No current facility-administered medications for this visit.     Past Medical History  Diagnosis Date  . Hypertension   . Diabetes mellitus   . Dyslipidemia   . Syncope   . PAF (paroxysmal atrial fibrillation)   . Pancreatitis     ROS:   All systems reviewed and negative except as noted in the HPI.   Past Surgical History  Procedure Laterality Date  . Radiofrequency ablation    . Pacemaker insertion  2001     Family History  Problem Relation Age of Onset  . Lung cancer Father   . Healthy Sister   . Healthy Brother   . Healthy Sister   . Healthy Sister   . Healthy Brother      History   Social History  . Marital Status: Married    Spouse Name: N/A    Number of Children: N/A  . Years of Education: N/A   Occupational History  . Retired    Social History Main Topics  . Smoking status: Former Smoker   Quit date: 02/16/2012  . Smokeless tobacco: Never Used  . Alcohol Use: No  . Drug Use: No  . Sexual Activity: Not on file   Other Topics Concern  . Not on file   Social History Narrative   No regular exercise     BP 162/74 mmHg  Pulse 61  Ht 6\' 1"  (1.854 m)  Wt 208 lb (94.348 kg)  BMI 27.45 kg/m2  Physical Exam:  Well appearing middle aged man, NAD HEENT: Unremarkable Neck:  No JVD, no thyromegally Lymphatics:  No adenopathy Back:  No CVA tenderness Lungs:  Clear with no wheezes HEART:  Regular rate rhythm, no murmurs, no rubs, no clicks Abd:  soft, positive bowel sounds, no organomegally, no rebound, no guarding Ext:  2 plus pulses, no edema, no cyanosis, no clubbing Skin:  No rashes no nodules Neuro:  CN II through XII intact, motor grossly intact  EKG - nsr with p synchronous ventricular pacing  DEVICE  Normal device function.  See PaceArt for details.   Assess/Plan:

## 2014-05-08 NOTE — Assessment & Plan Note (Signed)
His Medtronic DDD PM is working normally. Will recheck in several months. 

## 2014-05-08 NOTE — Assessment & Plan Note (Signed)
His blood pressure is elevated today. He states that at home it is well controlled. He will continue his current meds.

## 2014-05-08 NOTE — Assessment & Plan Note (Signed)
He is encouraged to keep his blood sugar controlled and to maintain a low fat diet.

## 2014-05-08 NOTE — Patient Instructions (Addendum)
Your physician recommends that you schedule a follow-up appointment in: 6 MONTHS IN THE DEVICE CLINIC AND 1 YEAR WITH DR Ladona RidgelAYLOR   Your physician recommends that you continue on your current medications as directed. Please refer to the Current Medication list given to you today.  Thank you for choosing [facility]!!

## 2014-05-09 ENCOUNTER — Encounter: Payer: Self-pay | Admitting: Internal Medicine

## 2014-05-09 LAB — PACEMAKER DEVICE OBSERVATION

## 2014-10-15 ENCOUNTER — Encounter (INDEPENDENT_AMBULATORY_CARE_PROVIDER_SITE_OTHER): Payer: Self-pay | Admitting: *Deleted

## 2014-10-30 ENCOUNTER — Ambulatory Visit (INDEPENDENT_AMBULATORY_CARE_PROVIDER_SITE_OTHER): Payer: Self-pay | Admitting: Internal Medicine

## 2014-11-07 ENCOUNTER — Encounter: Payer: Self-pay | Admitting: *Deleted

## 2014-12-05 ENCOUNTER — Encounter: Payer: Self-pay | Admitting: *Deleted

## 2014-12-30 ENCOUNTER — Encounter: Payer: Self-pay | Admitting: *Deleted

## 2015-01-30 ENCOUNTER — Encounter: Payer: Self-pay | Admitting: *Deleted

## 2015-03-04 ENCOUNTER — Encounter: Payer: Self-pay | Admitting: *Deleted

## 2015-03-18 ENCOUNTER — Encounter: Payer: Self-pay | Admitting: *Deleted

## 2015-04-07 ENCOUNTER — Encounter: Payer: Self-pay | Admitting: *Deleted

## 2015-05-04 ENCOUNTER — Encounter: Payer: Self-pay | Admitting: *Deleted

## 2015-05-19 ENCOUNTER — Encounter: Payer: Self-pay | Admitting: *Deleted

## 2015-06-02 ENCOUNTER — Encounter: Payer: Self-pay | Admitting: *Deleted

## 2015-10-15 LAB — LIPID PANEL
CHOLESTEROL: 119 mg/dL (ref 0–200)
HDL: 28 mg/dL — AB (ref 35–70)
LDL Cholesterol: 65 mg/dL
Triglycerides: 131 mg/dL (ref 40–160)

## 2015-10-15 LAB — HEMOGLOBIN A1C: HEMOGLOBIN A1C: 8.2

## 2015-10-16 ENCOUNTER — Encounter: Payer: Self-pay | Admitting: Internal Medicine

## 2015-10-16 ENCOUNTER — Ambulatory Visit (INDEPENDENT_AMBULATORY_CARE_PROVIDER_SITE_OTHER): Payer: BLUE CROSS/BLUE SHIELD | Admitting: Internal Medicine

## 2015-10-16 VITALS — BP 158/66 | HR 65 | Ht 73.0 in | Wt 200.0 lb

## 2015-10-16 DIAGNOSIS — I442 Atrioventricular block, complete: Secondary | ICD-10-CM | POA: Diagnosis not present

## 2015-10-16 DIAGNOSIS — I10 Essential (primary) hypertension: Secondary | ICD-10-CM

## 2015-10-16 NOTE — Progress Notes (Signed)
HPI Mr. Gabriel Sandoval returns today for PPM followup. He is a pleasant 65 yo man with CHB, s/p PPM insertion, HTN, DM and ongoing tobacco abuse. He denies chest pain or sob or edema. He has not had syncope. He notes that his blood pressure at home has been good. He is still working and has been active. No Known Allergies   Current Outpatient Prescriptions  Medication Sig Dispense Refill  . aspirin 81 MG tablet Take 81 mg by mouth daily.    . insulin aspart (NOVOLOG) 100 UNIT/ML injection Inject 10 Units into the skin 3 (three) times daily before meals.    . insulin glargine (LANTUS) 100 UNIT/ML injection Inject 35 Units into the skin as directed.    . olmesartan (BENICAR) 20 MG tablet Take 20 mg by mouth daily.    . SitaGLIPtin-MetFORMIN HCl 50-500 MG TB24 Take by mouth. Take one tablet twice each day.    . varenicline (CHANTIX) 1 MG tablet Take 1 mg by mouth 2 (two) times daily.     No current facility-administered medications for this visit.     Past Medical History  Diagnosis Date  . Hypertension   . Diabetes mellitus   . Dyslipidemia   . Syncope   . PAF (paroxysmal atrial fibrillation)   . Pancreatitis     ROS:   All systems reviewed and negative except as noted in the HPI.   Past Surgical History  Procedure Laterality Date  . Radiofrequency ablation    . Pacemaker insertion  2001     Family History  Problem Relation Age of Onset  . Lung cancer Father   . Healthy Sister   . Healthy Brother   . Healthy Sister   . Healthy Sister   . Healthy Brother      Social History   Social History  . Marital Status: Married    Spouse Name: N/A  . Number of Children: N/A  . Years of Education: N/A   Occupational History  . Retired    Social History Main Topics  . Smoking status: Former Smoker    Quit date: 02/16/2012  . Smokeless tobacco: Never Used  . Alcohol Use: No  . Drug Use: No  . Sexual Activity: Not on file   Other Topics Concern  . Not on file     Social History Narrative   No regular exercise     BP 158/66 mmHg  Pulse 65  Ht 6\' 1"  (1.854 m)  Wt 200 lb (90.719 kg)  BMI 26.39 kg/m2  Physical Exam:  Well appearing middle aged man, NAD HEENT: Unremarkable Neck:  No JVD, no thyromegally Lymphatics:  No adenopathy Back:  No CVA tenderness Lungs:  Clear with no wheezes HEART:  Regular rate rhythm, no murmurs, no rubs, no clicks Abd:  soft, positive bowel sounds, no organomegally, no rebound, no guarding Ext:  2 plus pulses, no edema, no cyanosis, no clubbing Skin:  No rashes no nodules Neuro:  CN II through XII intact, motor grossly intact  EKG - nsr with p synchronous ventricular pacing  DEVICE  Normal device function.  See PaceArt for details.   Assess/Plan: 1. Preoperative evaluation - he is low risk for cardiovascular problems from pending shoulder surgery. He will need his PPM reprogrammed on the day of surgery as electrocautery will inhibit pacing and he is dependent. 2. PPM - his medtronic DDD PM is working normally. Will follow up in several months. 3. HTN - his blood  pressure is elevated. It is typically much better when not in the doctor's office. 4. Tobacco abuse - he is trying to stop smoking.  Leonia Reeves.D.

## 2015-10-16 NOTE — Patient Instructions (Signed)

## 2015-10-20 LAB — CUP PACEART INCLINIC DEVICE CHECK
Battery Remaining Longevity: 49 mo
Brady Statistic AP VP Percent: 77 %
Date Time Interrogation Session: 20170512132237
Implantable Lead Location: 753859
Implantable Lead Location: 753860
Lead Channel Impedance Value: 406 Ohm
Lead Channel Pacing Threshold Amplitude: 1.25 V
Lead Channel Sensing Intrinsic Amplitude: 1.4 mV
Lead Channel Setting Pacing Amplitude: 2 V
Lead Channel Setting Pacing Pulse Width: 0.4 ms
MDC IDC LEAD IMPLANT DT: 20010110
MDC IDC LEAD IMPLANT DT: 20010110
MDC IDC MSMT BATTERY IMPEDANCE: 1047 Ohm
MDC IDC MSMT BATTERY VOLTAGE: 2.77 V
MDC IDC MSMT LEADCHNL RA PACING THRESHOLD AMPLITUDE: 0.75 V
MDC IDC MSMT LEADCHNL RA PACING THRESHOLD PULSEWIDTH: 0.4 ms
MDC IDC MSMT LEADCHNL RV IMPEDANCE VALUE: 390 Ohm
MDC IDC MSMT LEADCHNL RV PACING THRESHOLD PULSEWIDTH: 0.4 ms
MDC IDC SET LEADCHNL RV PACING AMPLITUDE: 2.5 V
MDC IDC SET LEADCHNL RV SENSING SENSITIVITY: 2 mV
MDC IDC STAT BRADY AP VS PERCENT: 0 %
MDC IDC STAT BRADY AS VP PERCENT: 23 %
MDC IDC STAT BRADY AS VS PERCENT: 0 %

## 2015-12-11 ENCOUNTER — Encounter: Payer: Self-pay | Admitting: "Endocrinology

## 2015-12-11 ENCOUNTER — Ambulatory Visit (INDEPENDENT_AMBULATORY_CARE_PROVIDER_SITE_OTHER): Payer: BLUE CROSS/BLUE SHIELD | Admitting: "Endocrinology

## 2015-12-11 VITALS — BP 146/80 | HR 68 | Ht 73.0 in | Wt 197.0 lb

## 2015-12-11 DIAGNOSIS — I10 Essential (primary) hypertension: Secondary | ICD-10-CM | POA: Diagnosis not present

## 2015-12-11 DIAGNOSIS — K861 Other chronic pancreatitis: Secondary | ICD-10-CM | POA: Diagnosis not present

## 2015-12-11 DIAGNOSIS — E1065 Type 1 diabetes mellitus with hyperglycemia: Secondary | ICD-10-CM

## 2015-12-11 DIAGNOSIS — E108 Type 1 diabetes mellitus with unspecified complications: Secondary | ICD-10-CM

## 2015-12-11 DIAGNOSIS — E785 Hyperlipidemia, unspecified: Secondary | ICD-10-CM | POA: Diagnosis not present

## 2015-12-11 DIAGNOSIS — IMO0002 Reserved for concepts with insufficient information to code with codable children: Secondary | ICD-10-CM | POA: Insufficient documentation

## 2015-12-11 NOTE — Progress Notes (Signed)
Subjective:    Patient ID: Gabriel Sandoval, male    DOB: 1951-03-19. Patient is being seen in consultation for management of diabetes requested by  Philbert RiserPETERSON, SUSAN W, MD  Past Medical History  Diagnosis Date  . Hypertension   . Diabetes mellitus   . Dyslipidemia   . Syncope   . PAF (paroxysmal atrial fibrillation) (HCC)   . Pancreatitis    Past Surgical History  Procedure Laterality Date  . Radiofrequency ablation    . Pacemaker insertion  2001   Social History   Social History  . Marital Status: Married    Spouse Name: N/A  . Number of Children: N/A  . Years of Education: N/A   Occupational History  . Retired    Social History Main Topics  . Smoking status: Former Smoker    Quit date: 02/16/2012  . Smokeless tobacco: Never Used  . Alcohol Use: No  . Drug Use: No  . Sexual Activity: Not Asked   Other Topics Concern  . None   Social History Narrative   No regular exercise   Outpatient Encounter Prescriptions as of 12/11/2015  Medication Sig  . insulin aspart (NOVOLOG FLEXPEN) 100 UNIT/ML FlexPen Inject 5-11 Units into the skin 3 (three) times daily with meals.  . Insulin Glargine (LANTUS SOLOSTAR) 100 UNIT/ML Solostar Pen Inject 20 Units into the skin daily at 10 pm.  . aspirin 81 MG tablet Take 81 mg by mouth daily.  Marland Kitchen. olmesartan (BENICAR) 20 MG tablet Take 20 mg by mouth daily.  . varenicline (CHANTIX) 1 MG tablet Take 1 mg by mouth 2 (two) times daily.  . [DISCONTINUED] insulin aspart (NOVOLOG) 100 UNIT/ML injection Inject 10 Units into the skin 3 (three) times daily before meals.  . [DISCONTINUED] insulin glargine (LANTUS) 100 UNIT/ML injection Inject 35 Units into the skin as directed.  . [DISCONTINUED] SitaGLIPtin-MetFORMIN HCl 50-500 MG TB24 Take by mouth. Take one tablet twice each day.   No facility-administered encounter medications on file as of 12/11/2015.   ALLERGIES: No Known Allergies VACCINATION STATUS:  There is no immunization history on  file for this patient.  Diabetes He presents for his initial diabetic visit. He has type 1 (This is a pancreatic diabetes due to chronic pancreatitis. Hence, it is administratively classified as type I for management purposes.) diabetes mellitus. His disease course has been worsening. There are no hypoglycemic associated symptoms. Pertinent negatives for hypoglycemia include no confusion, headaches, pallor or seizures. Associated symptoms include polydipsia and polyuria. Pertinent negatives for diabetes include no chest pain, no fatigue, no polyphagia and no weakness. There are no hypoglycemic complications. Symptoms are worsening. Diabetic complications include heart disease. Risk factors for coronary artery disease include male sex, sedentary lifestyle and tobacco exposure (He has history of obesity, weighing past 305 pounds. He progressively lost weight due to pancreatitis to current level of 196 pounds.). Current diabetic treatment includes insulin injections and oral agent (monotherapy). His weight is decreasing steadily (He has history of obesity, weighing past 305 pounds. He progressively lost weight due to pancreatitis to current level of 196 pounds.). He is following a generally unhealthy diet. When asked about meal planning, he reported none. He has not had a previous visit with a dietitian. He never participates in exercise. Home blood sugar record trend: He did not bring the meter nor logs to review today. An ACE inhibitor/angiotensin II receptor blocker is being taken.  Hypertension This is a chronic problem. The current episode started more than  1 year ago. Pertinent negatives include no chest pain, headaches, neck pain, palpitations or shortness of breath. Risk factors for coronary artery disease include diabetes mellitus, male gender, smoking/tobacco exposure and sedentary lifestyle. Past treatments include angiotensin blockers.       Review of Systems  Constitutional: Positive for  unexpected weight change. Negative for fever, chills and fatigue.       He loses weight unintentionally, this is mainly due to his pancreatitis.  HENT: Negative for dental problem, mouth sores and trouble swallowing.   Eyes: Negative for visual disturbance.  Respiratory: Negative for cough, choking, chest tightness, shortness of breath and wheezing.   Cardiovascular: Negative for chest pain, palpitations and leg swelling.  Gastrointestinal: Negative for nausea, vomiting, abdominal pain, diarrhea, constipation and abdominal distention.  Endocrine: Positive for polydipsia and polyuria. Negative for polyphagia.  Genitourinary: Negative for dysuria, urgency, hematuria and flank pain.  Musculoskeletal: Negative for myalgias, back pain, gait problem and neck pain.  Skin: Negative for pallor, rash and wound.  Neurological: Negative for seizures, syncope, weakness, numbness and headaches.  Psychiatric/Behavioral: Negative.  Negative for confusion and dysphoric mood.    Objective:    BP 146/80 mmHg  Pulse 68  Ht  (1.854 m)  Wt 197 lb (89.359 kg)  BMI 26.00 kg/m2  SpO2 95%  Wt Readings from Last 3 Encounters:  12/11/15 197 lb (89.359 kg)  10/16/15 200 lb (90.719 kg)  05/08/14 208 lb (94.348 kg)    Physical Exam  Constitutional: He is oriented to person, place, and time. He is cooperative. No distress.  HENT:  Head: Normocephalic and atraumatic.  Eyes: EOM are normal.  Neck: Normal range of motion. Neck supple. No tracheal deviation present. No thyromegaly present.  Cardiovascular: Normal rate, S1 normal, S2 normal and normal heart sounds.  Exam reveals no gallop.   No murmur heard. Pulses:      Dorsalis pedis pulses are 1+ on the right side, and 1+ on the left side.       Posterior tibial pulses are 1+ on the right side, and 1+ on the left side.  Pulmonary/Chest: Breath sounds normal. No respiratory distress. He has no wheezes.  Abdominal: Soft. Bowel sounds are normal. He exhibits  no distension. There is no tenderness. There is no guarding and no CVA tenderness.  Musculoskeletal: He exhibits no edema.       Right shoulder: He exhibits no swelling and no deformity.  Neurological: He is alert and oriented to person, place, and time. He has normal strength and normal reflexes. No cranial nerve deficit or sensory deficit. Gait normal.  Skin: Skin is warm and dry. No rash noted. No cyanosis. Nails show no clubbing.  Psychiatric: He has a normal mood and affect. His speech is normal and behavior is normal. Judgment and thought content normal. Cognition and memory are normal.     CMP ( most recent) CMP     Component Value Date/Time   NA 135 02/25/2012 0510   K 2.9* 02/25/2012 0510   CL 100 02/25/2012 0510   CO2 27 02/25/2012 0510   GLUCOSE 201* 02/25/2012 0510   BUN 17 02/25/2012 0510   CREATININE 0.78 02/25/2012 0510   CALCIUM 8.7 02/25/2012 0510   PROT 8.4* 02/23/2012 1645   ALBUMIN 4.4 02/23/2012 1645   AST 9 02/23/2012 1645   ALT 9 02/23/2012 1645   ALKPHOS 99 02/23/2012 1645   BILITOT 1.4* 02/23/2012 1645   GFRNONAA >90 02/25/2012 0510   GFRAA >90 02/25/2012  0510     Diabetic Labs (most recent): Lab Results  Component Value Date   HGBA1C 8.2 10/15/2015     Lipid Panel ( most recent) Lipid Panel     Component Value Date/Time   CHOL 119 10/15/2015   TRIG 131 10/15/2015   HDL 28* 10/15/2015   LDLCALC 65 10/15/2015       Assessment & Plan:   1. Uncontrolled type 1 diabetes mellitus with other specified complication (HCC) -His diabetes most likely pancreatic diabetes,  Hence, administratively classified as type I for management purposes.  - Patient has currently uncontrolled symptomatic type 1 DM since  65 years of age after he was diagnosed with pancreatitis,  with most recent A1c of 8.2 %. Recent labs reviewed.   His diabetes is complicated by chronic hepatitis causing weight loss progressively from 305 to 196 pounds and patient remains at a  high risk for more acute and chronic complications of diabetes which include CAD, CVA, CKD, retinopathy, and neuropathy. These are all discussed in detail with the patient.  - I have counseled the patient on diet management  by adopting a carbohydrate restricted/protein rich diet.  - Suggestion is made for patient to avoid simple carbohydrates   from their diet including Cakes , Desserts, Ice Cream,  Soda (  diet and regular) , Sweet Tea , Candies,  Chips, Cookies, Artificial Sweeteners,   and "Sugar-free" Products . This will help patient to have stable blood glucose profile and potentially avoid unintended weight gain.  - I encouraged the patient to switch to  unprocessed or minimally processed complex starch and increased protein intake (animal or plant source), fruits, and vegetables.  - Patient is advised to stick to a routine mealtimes to eat 3 meals  a day and avoid unnecessary snacks ( to snack only to correct hypoglycemia).  - The patient will be scheduled with Norm Salt, RDN, CDE for individualized DM education.  - I have approached patient with the following individualized plan to manage diabetes and patient agrees:   - I  will proceed to readjust his basal insulin Lantus to 20  units QHS, and   increase his prandial insulin NovoLog to 5 units TIDAC for pre-meal BG readings of 90-150mg /dl, plus patient specific correction dose for unexpected hyperglycemia above 150mg /dl, associated with strict monitoring of glucose  AC and HS. - Patient is warned not to take insulin without proper monitoring per orders. -Adjustment parameters are given for hypo and hyperglycemia in writing. -Patient is encouraged to call clinic for blood glucose levels less than 70 or above 300 mg /dl.  - I will discontinue  onglyza, risk outweighs benefit for this patient. -Patient is not a candidate for  for metformin, SGLT2 inhibitors, incretin therapy. - Insulin is his exclusive choice to treat his  diabetes.  - Given his chronic pancreatitis, he may need basal/bolus insulin therapy for life to control DIABETES. He may also need Creon to help with malabsorption if he continues to lose weight. - The main goal of therapy in him will be avoiding hypoglycemia due to the fact that he is at risk of hypoglycemia from loss of glucagon response due to chronic pancreatitis.   - Patient specific target  A1c;  LDL, HDL, Triglycerides, and  Waist Circumference were discussed in detail.  2) BP/HTN: controlled. Continue current medications including ARB. 3) Lipids/HPL:  Controlled, LDL 65. He is not on statins.  4)  Weight/Diet: CDE Consult will be initiated , exercise, and detailed carbohydrates  information provided.  5) Chronic Care/Health Maintenance:  -Patient is on ARB and  encouraged to continue to follow up with Ophthalmology, Podiatrist at least yearly or according to recommendations, and advised to  Quit ( he has smoked for 40+ years ) smoking. I have recommended yearly flu vaccine and pneumonia vaccination at least every 5 years; moderate intensity exercise for up to 150 minutes weekly; and  sleep for at least 7 hours a day.  - 60 minutes of time was spent on the care of this patient , 50% of which was applied for counseling on diabetes complications and their preventions.  - Patient to bring meter and  blood glucose logs during their next visit.   - I advised patient to maintain close follow up with PETERSON, Roselyn BeringSUSAN W, MD for primary care needs.  Follow up plan: - Return in about 1 week (around 12/18/2015) for diabetes, high blood pressure, high cholesterol, follow up with meter and logs- no labs.  Marquis LunchGebre Katha Kuehne, MD Phone: [phone]  Fax: [phone]   12/11/2015, 11:24 AM

## 2015-12-14 ENCOUNTER — Encounter: Payer: Self-pay | Admitting: Internal Medicine

## 2015-12-22 ENCOUNTER — Ambulatory Visit (INDEPENDENT_AMBULATORY_CARE_PROVIDER_SITE_OTHER): Payer: BLUE CROSS/BLUE SHIELD | Admitting: "Endocrinology

## 2015-12-22 ENCOUNTER — Encounter: Payer: Self-pay | Admitting: "Endocrinology

## 2015-12-22 VITALS — BP 165/78 | HR 79 | Ht 73.0 in | Wt 200.0 lb

## 2015-12-22 DIAGNOSIS — I10 Essential (primary) hypertension: Secondary | ICD-10-CM

## 2015-12-22 DIAGNOSIS — E1065 Type 1 diabetes mellitus with hyperglycemia: Secondary | ICD-10-CM | POA: Diagnosis not present

## 2015-12-22 DIAGNOSIS — E108 Type 1 diabetes mellitus with unspecified complications: Secondary | ICD-10-CM

## 2015-12-22 DIAGNOSIS — IMO0002 Reserved for concepts with insufficient information to code with codable children: Secondary | ICD-10-CM

## 2015-12-22 DIAGNOSIS — E785 Hyperlipidemia, unspecified: Secondary | ICD-10-CM | POA: Diagnosis not present

## 2015-12-22 MED ORDER — OLMESARTAN MEDOXOMIL 20 MG PO TABS: 20.0000 mg | ORAL_TABLET | Freq: Every day | ORAL | Status: AC

## 2015-12-22 NOTE — Progress Notes (Signed)
Subjective:    Patient ID: Gabriel Sandoval, male    DOB: 03-08-1960. Patient is being seen in consultation for management of diabetes requested by  Philbert Riser, MD  Past Medical History  Diagnosis Date  . Hypertension   . Diabetes mellitus   . Dyslipidemia   . Syncope   . PAF (paroxysmal atrial fibrillation) (HCC)   . Pancreatitis    Past Surgical History  Procedure Laterality Date  . Radiofrequency ablation    . Pacemaker insertion  2001   Social History   Social History  . Marital Status: Married    Spouse Name: N/A  . Number of Children: N/A  . Years of Education: N/A   Occupational History  . Retired    Social History Main Topics  . Smoking status: Former Smoker    Quit date: 02/16/2012  . Smokeless tobacco: Never Used  . Alcohol Use: No  . Drug Use: No  . Sexual Activity: Not Asked   Other Topics Concern  . None   Social History Narrative   No regular exercise   Outpatient Encounter Prescriptions as of 12/22/2015  Medication Sig  . aspirin 81 MG tablet Take 81 mg by mouth daily.  . insulin aspart (NOVOLOG FLEXPEN) 100 UNIT/ML FlexPen Inject 6-12 Units into the skin 3 (three) times daily with meals.  . Insulin Glargine (LANTUS SOLOSTAR) 100 UNIT/ML Solostar Pen Inject 22 Units into the skin daily at 10 pm.  . olmesartan (BENICAR) 20 MG tablet Take 1 tablet (20 mg total) by mouth daily.  . varenicline (CHANTIX) 1 MG tablet Take 1 mg by mouth 2 (two) times daily.  . [DISCONTINUED] olmesartan (BENICAR) 20 MG tablet Take 20 mg by mouth daily.   No facility-administered encounter medications on file as of 12/22/2015.   ALLERGIES: No Known Allergies VACCINATION STATUS:  There is no immunization history on file for this patient.  Diabetes He presents for his follow-up diabetic visit. He has type 1 (This is a pancreatic diabetes due to chronic pancreatitis. Hence, it is administratively classified as type I for management purposes.) diabetes  mellitus. His disease course has been improving. There are no hypoglycemic associated symptoms. Pertinent negatives for hypoglycemia include no confusion, headaches, pallor or seizures. Associated symptoms include polydipsia and polyuria. Pertinent negatives for diabetes include no chest pain, no fatigue, no polyphagia and no weakness. There are no hypoglycemic complications. Symptoms are improving. Diabetic complications include heart disease. Risk factors for coronary artery disease include male sex, sedentary lifestyle and tobacco exposure (He has history of obesity, weighing past 305 pounds. He progressively lost weight due to pancreatitis to current level of 196 pounds.). Current diabetic treatment includes insulin injections and oral agent (monotherapy). His weight is increasing steadily (He has history of obesity, weighing past 305 pounds.). He is following a generally unhealthy diet. When asked about meal planning, he reported none. He has not had a previous visit with a dietitian. He never participates in exercise. His breakfast blood glucose range is generally 140-180 mg/dl. His lunch blood glucose range is generally 180-200 mg/dl. His dinner blood glucose range is generally 180-200 mg/dl. His overall blood glucose range is 180-200 mg/dl. An ACE inhibitor/angiotensin II receptor blocker is being taken.  Hypertension This is a chronic problem. The current episode started more than 1 year ago. Pertinent negatives include no chest pain, headaches, neck pain, palpitations or shortness of breath. Risk factors for coronary artery disease include diabetes mellitus, male gender, smoking/tobacco exposure and sedentary  lifestyle. Past treatments include angiotensin blockers.       Review of Systems  Constitutional: Positive for unexpected weight change. Negative for fever, chills and fatigue.       He loses weight unintentionally, this is mainly due to his pancreatitis.  HENT: Negative for dental problem,  mouth sores and trouble swallowing.   Eyes: Negative for visual disturbance.  Respiratory: Negative for cough, choking, chest tightness, shortness of breath and wheezing.   Cardiovascular: Negative for chest pain, palpitations and leg swelling.  Gastrointestinal: Negative for nausea, vomiting, abdominal pain, diarrhea, constipation and abdominal distention.  Endocrine: Positive for polydipsia and polyuria. Negative for polyphagia.  Genitourinary: Negative for dysuria, urgency, hematuria and flank pain.  Musculoskeletal: Negative for myalgias, back pain, gait problem and neck pain.  Skin: Negative for pallor, rash and wound.  Neurological: Negative for seizures, syncope, weakness, numbness and headaches.  Psychiatric/Behavioral: Negative.  Negative for confusion and dysphoric mood.    Objective:    BP 165/78 mmHg  Pulse 79  Ht 6\' 1"  (1.854 m)  Wt 200 lb (90.719 kg)  BMI 26.39 kg/m2  Wt Readings from Last 3 Encounters:  12/22/15 200 lb (90.719 kg)  12/11/15 197 lb (89.359 kg)  10/16/15 200 lb (90.719 kg)    Physical Exam  Constitutional: He is oriented to person, place, and time. He is cooperative. No distress.  HENT:  Head: Normocephalic and atraumatic.  Eyes: EOM are normal.  Neck: Normal range of motion. Neck supple. No tracheal deviation present. No thyromegaly present.  Cardiovascular: Normal rate, S1 normal, S2 normal and normal heart sounds.  Exam reveals no gallop.   No murmur heard. Pulses:      Dorsalis pedis pulses are 1+ on the right side, and 1+ on the left side.       Posterior tibial pulses are 1+ on the right side, and 1+ on the left side.  Pulmonary/Chest: Breath sounds normal. No respiratory distress. He has no wheezes.  Abdominal: Soft. Bowel sounds are normal. He exhibits no distension. There is no tenderness. There is no guarding and no CVA tenderness.  Musculoskeletal: He exhibits no edema.       Right shoulder: He exhibits no swelling and no deformity.   Neurological: He is alert and oriented to person, place, and time. He has normal strength and normal reflexes. No cranial nerve deficit or sensory deficit. Gait normal.  Skin: Skin is warm and dry. No rash noted. No cyanosis. Nails show no clubbing.  Psychiatric: He has a normal mood and affect. His speech is normal and behavior is normal. Judgment and thought content normal. Cognition and memory are normal.     CMP ( most recent) CMP     Component Value Date/Time   NA 135 02/25/2012 0510   K 2.9* 02/25/2012 0510   CL 100 02/25/2012 0510   CO2 27 02/25/2012 0510   GLUCOSE 201* 02/25/2012 0510   BUN 17 02/25/2012 0510   CREATININE 0.78 02/25/2012 0510   CALCIUM 8.7 02/25/2012 0510   PROT 8.4* 02/23/2012 1645   ALBUMIN 4.4 02/23/2012 1645   AST 9 02/23/2012 1645   ALT 9 02/23/2012 1645   ALKPHOS 99 02/23/2012 1645   BILITOT 1.4* 02/23/2012 1645   GFRNONAA >90 02/25/2012 0510   GFRAA >90 02/25/2012 0510     Diabetic Labs (most recent): Lab Results  Component Value Date   HGBA1C 8.2 10/15/2015     Lipid Panel ( most recent) Lipid Panel  Component Value Date/Time   CHOL 119 10/15/2015   TRIG 131 10/15/2015   HDL 28* 10/15/2015   LDLCALC 65 10/15/2015       Assessment & Plan:   1. Uncontrolled type 1 diabetes mellitus with other specified complication (HCC) -His diabetes most likely pancreatic diabetes,  Hence, administratively classified as type I for management purposes.  - Patient has currently uncontrolled symptomatic type 1 DM since  65 years of age after he was diagnosed with pancreatitis,  with most recent A1c of 8.2 %. Recent labs reviewed.   His diabetes is complicated by chronic hepatitis causing weight loss progressively from 305 to 196 pounds and patient remains at a high risk for more acute and chronic complications of diabetes which include CAD, CVA, CKD, retinopathy, and neuropathy. These are all discussed in detail with the patient.  - I have  counseled the patient on diet management  by adopting a carbohydrate restricted/protein rich diet.  - Suggestion is made for patient to avoid simple carbohydrates   from their diet including Cakes , Desserts, Ice Cream,  Soda (  diet and regular) , Sweet Tea , Candies,  Chips, Cookies, Artificial Sweeteners,   and "Sugar-free" Products . This will help patient to have stable blood glucose profile and potentially avoid unintended weight gain.  - I encouraged the patient to switch to  unprocessed or minimally processed complex starch and increased protein intake (animal or plant source), fruits, and vegetables.  - Patient is advised to stick to a routine mealtimes to eat 3 meals  a day and avoid unnecessary snacks ( to snack only to correct hypoglycemia).  - The patient will be scheduled with Norm Salt, RDN, CDE for individualized DM education.  - I have approached patient with the following individualized plan to manage diabetes and patient agrees:   - He came with better engagement and improved glucose profile. He still has significant postprandial hyperglycemia. He did have to random hypoglycemia and 50s. - I  will proceed to readjust his basal insulin Lantus to 22  units QHS, and   increase his prandial insulin NovoLog to 6 units TIDAC for pre-meal BG readings of 90-150mg /dl, plus patient specific correction dose for unexpected hyperglycemia above 150mg /dl, associated with strict monitoring of glucose  AC and HS. - Patient is warned not to take insulin without proper monitoring per orders. -Adjustment parameters are given for hypo and hyperglycemia in writing. -Patient is encouraged to call clinic for blood glucose levels less than 70 or above 300 mg /dl.   -Patient is not a candidate for  for metformin, SGLT2 inhibitors, incretin therapy. - Insulin is his exclusive choice to treat his diabetes.  - Given his chronic pancreatitis, he may need basal/bolus insulin therapy for life to control  DIABETES. He may also need Creon to help with malabsorption if he continues to lose weight. - The main goal of therapy in him will be avoiding hypoglycemia due to the fact that he is at risk of hypoglycemia from loss of glucagon response due to chronic pancreatitis.   - Patient specific target  A1c;  LDL, HDL, Triglycerides, and  Waist Circumference were discussed in detail.  2) BP/HTN: controlled. Continue current medications including ARB. I refilled his Benicar 20 mg by mouth daily. 3) Lipids/HPL:  Controlled, LDL 65. He is not on statins.  4)  Weight/Diet: CDE Consult will be initiated , exercise, and detailed carbohydrates information provided.  5) Chronic Care/Health Maintenance:  -Patient is on ARB and  encouraged to continue to follow up with Ophthalmology, Podiatrist at least yearly or according to recommendations, and advised to  Quit ( he has smoked for 40+ years ) smoking. I have recommended yearly flu vaccine and pneumonia vaccination at least every 5 years; moderate intensity exercise for up to 150 minutes weekly; and  sleep for at least 7 hours a day.  - 25 minutes of time was spent on the care of this patient , 50% of which was applied for counseling on diabetes complications and their preventions.  - Patient to bring meter and  blood glucose logs during their next visit.   - I advised patient to maintain close follow up with PETERSON, Roselyn BeringSUSAN W, MD for primary care needs.  Follow up plan: - Return in about 6 weeks (around 02/02/2016) for follow up with pre-visit labs, meter, and logs.  Marquis LunchGebre Cinderella Christoffersen, MD Phone: [phone]  Fax: [phone]   12/22/2015, 8:39 AM

## 2016-02-04 ENCOUNTER — Ambulatory Visit: Payer: BLUE CROSS/BLUE SHIELD | Admitting: "Endocrinology

## 2016-02-11 ENCOUNTER — Other Ambulatory Visit: Payer: Self-pay | Admitting: "Endocrinology

## 2016-02-12 LAB — HGB A1C W/O EAG: Hgb A1c MFr Bld: 7.8 % — ABNORMAL HIGH (ref 4.8–5.6)

## 2016-02-22 ENCOUNTER — Ambulatory Visit (INDEPENDENT_AMBULATORY_CARE_PROVIDER_SITE_OTHER): Payer: BLUE CROSS/BLUE SHIELD | Admitting: "Endocrinology

## 2016-02-22 ENCOUNTER — Encounter: Payer: Self-pay | Admitting: "Endocrinology

## 2016-02-22 VITALS — BP 136/77 | HR 81 | Ht 73.0 in | Wt 194.0 lb

## 2016-02-22 DIAGNOSIS — E785 Hyperlipidemia, unspecified: Secondary | ICD-10-CM

## 2016-02-22 DIAGNOSIS — IMO0002 Reserved for concepts with insufficient information to code with codable children: Secondary | ICD-10-CM

## 2016-02-22 DIAGNOSIS — E1065 Type 1 diabetes mellitus with hyperglycemia: Secondary | ICD-10-CM | POA: Diagnosis not present

## 2016-02-22 DIAGNOSIS — E108 Type 1 diabetes mellitus with unspecified complications: Secondary | ICD-10-CM | POA: Diagnosis not present

## 2016-02-22 DIAGNOSIS — Z720 Tobacco use: Secondary | ICD-10-CM | POA: Diagnosis not present

## 2016-02-22 DIAGNOSIS — F17200 Nicotine dependence, unspecified, uncomplicated: Secondary | ICD-10-CM | POA: Insufficient documentation

## 2016-02-22 DIAGNOSIS — I10 Essential (primary) hypertension: Secondary | ICD-10-CM | POA: Diagnosis not present

## 2016-02-22 NOTE — Progress Notes (Signed)
Subjective:    Patient ID: Gabriel Sandoval, male    DOB: 1951/06/04. Patient is being seen in consultation for management of diabetes requested by  Philbert Riser, MD  Past Medical History:  Diagnosis Date  . Diabetes mellitus   . Dyslipidemia   . Hypertension   . PAF (paroxysmal atrial fibrillation) (HCC)   . Pancreatitis   . Syncope    Past Surgical History:  Procedure Laterality Date  . PACEMAKER INSERTION  2001  . RADIOFREQUENCY ABLATION     Social History   Social History  . Marital status: Married    Spouse name: N/A  . Number of children: N/A  . Years of education: N/A   Occupational History  . Retired Nature conservation officer   Social History Main Topics  . Smoking status: Former Smoker    Quit date: 02/16/2012  . Smokeless tobacco: Never Used  . Alcohol use No  . Drug use: No  . Sexual activity: Not Asked   Other Topics Concern  . None   Social History Narrative   No regular exercise   Outpatient Encounter Prescriptions as of 02/22/2016  Medication Sig  . aspirin 81 MG tablet Take 81 mg by mouth daily.  . insulin aspart (NOVOLOG FLEXPEN) 100 UNIT/ML FlexPen Inject 8-14 Units into the skin 3 (three) times daily with meals.  . Insulin Glargine (LANTUS SOLOSTAR) 100 UNIT/ML Solostar Pen Inject 20 Units into the skin daily at 10 pm.   . olmesartan (BENICAR) 20 MG tablet Take 1 tablet (20 mg total) by mouth daily.  . [DISCONTINUED] varenicline (CHANTIX) 1 MG tablet Take 1 mg by mouth 2 (two) times daily.   No facility-administered encounter medications on file as of 02/22/2016.    ALLERGIES: No Known Allergies VACCINATION STATUS:  There is no immunization history on file for this patient.  Diabetes  He presents for his follow-up diabetic visit. He has type 1 (This is a pancreatic diabetes due to chronic pancreatitis. Hence, it is administratively classified as type I for management purposes.) diabetes mellitus. His disease course has been improving.  There are no hypoglycemic associated symptoms. Pertinent negatives for hypoglycemia include no confusion, headaches, pallor or seizures. Associated symptoms include polydipsia and polyuria. Pertinent negatives for diabetes include no chest pain, no fatigue, no polyphagia and no weakness. There are no hypoglycemic complications. Symptoms are improving. Diabetic complications include heart disease. Risk factors for coronary artery disease include male sex, sedentary lifestyle and tobacco exposure (He has history of obesity, weighing past 305 pounds. He progressively lost weight due to pancreatitis to current level of 196 pounds.). Current diabetic treatment includes insulin injections and oral agent (monotherapy). His weight is stable (He has history of obesity, weighing past 305 pounds.). He is following a generally unhealthy diet. When asked about meal planning, he reported none. He has not had a previous visit with a dietitian. He never participates in exercise. His breakfast blood glucose range is generally 140-180 mg/dl. His lunch blood glucose range is generally 180-200 mg/dl. His dinner blood glucose range is generally 180-200 mg/dl. His overall blood glucose range is 180-200 mg/dl. An ACE inhibitor/angiotensin II receptor blocker is being taken.  Hypertension  This is a chronic problem. The current episode started more than 1 year ago. Pertinent negatives include no chest pain, headaches, neck pain, palpitations or shortness of breath. Risk factors for coronary artery disease include diabetes mellitus, male gender, smoking/tobacco exposure and sedentary lifestyle. Past treatments include angiotensin blockers.  Review of Systems  Constitutional: Positive for unexpected weight change. Negative for chills, fatigue and fever.       He loses weight unintentionally, this is mainly due to his pancreatitis.  HENT: Negative for dental problem, mouth sores and trouble swallowing.   Eyes: Negative for  visual disturbance.  Respiratory: Negative for cough, choking, chest tightness, shortness of breath and wheezing.   Cardiovascular: Negative for chest pain, palpitations and leg swelling.  Gastrointestinal: Negative for abdominal distention, abdominal pain, constipation, diarrhea, nausea and vomiting.  Endocrine: Positive for polydipsia and polyuria. Negative for polyphagia.  Genitourinary: Negative for dysuria, flank pain, hematuria and urgency.  Musculoskeletal: Negative for back pain, gait problem, myalgias and neck pain.  Skin: Negative for pallor, rash and wound.  Neurological: Negative for seizures, syncope, weakness, numbness and headaches.  Psychiatric/Behavioral: Negative.  Negative for confusion and dysphoric mood.    Objective:    BP 136/77   Pulse 81   Ht 6\' 1"  (1.854 m)   Wt 194 lb (88 kg)   BMI 25.60 kg/m   Wt Readings from Last 3 Encounters:  02/22/16 194 lb (88 kg)  12/22/15 200 lb (90.7 kg)  12/11/15 197 lb (89.4 kg)    Physical Exam  Constitutional: He is oriented to person, place, and time. He is cooperative. No distress.  HENT:  Head: Normocephalic and atraumatic.  Eyes: EOM are normal.  Neck: Normal range of motion. Neck supple. No tracheal deviation present. No thyromegaly present.  Cardiovascular: Normal rate, S1 normal, S2 normal and normal heart sounds.  Exam reveals no gallop.   No murmur heard. Pulses:      Dorsalis pedis pulses are 1+ on the right side, and 1+ on the left side.       Posterior tibial pulses are 1+ on the right side, and 1+ on the left side.  Pulmonary/Chest: Breath sounds normal. No respiratory distress. He has no wheezes.  Abdominal: Soft. Bowel sounds are normal. He exhibits no distension. There is no tenderness. There is no guarding and no CVA tenderness.  Musculoskeletal: He exhibits no edema.       Right shoulder: He exhibits no swelling and no deformity.  Neurological: He is alert and oriented to person, place, and time. He  has normal strength and normal reflexes. No cranial nerve deficit or sensory deficit. Gait normal.  Skin: Skin is warm and dry. No rash noted. No cyanosis. Nails show no clubbing.  Psychiatric: He has a normal mood and affect. His speech is normal and behavior is normal. Judgment and thought content normal. Cognition and memory are normal.     CMP ( most recent) CMP     Component Value Date/Time   NA 135 02/25/2012 0510   K 2.9 (L) 02/25/2012 0510   CL 100 02/25/2012 0510   CO2 27 02/25/2012 0510   GLUCOSE 201 (H) 02/25/2012 0510   BUN 17 02/25/2012 0510   CREATININE 0.78 02/25/2012 0510   CALCIUM 8.7 02/25/2012 0510   PROT 8.4 (H) 02/23/2012 1645   ALBUMIN 4.4 02/23/2012 1645   AST 9 02/23/2012 1645   ALT 9 02/23/2012 1645   ALKPHOS 99 02/23/2012 1645   BILITOT 1.4 (H) 02/23/2012 1645   GFRNONAA >90 02/25/2012 0510   GFRAA >90 02/25/2012 0510     Diabetic Labs (most recent): Lab Results  Component Value Date   HGBA1C 7.8 (H) 02/11/2016   HGBA1C 8.2 10/15/2015     Lipid Panel ( most recent) Lipid Panel  Component Value Date/Time   CHOL 119 10/15/2015   TRIG 131 10/15/2015   HDL 28 (A) 10/15/2015   LDLCALC 65 10/15/2015       Assessment & Plan:   1. Uncontrolled type 1 diabetes mellitus with other specified complication (HCC) -His diabetes most likely pancreatic diabetes,  Hence, administratively classified as type I for management purposes.  - Patient has currently uncontrolled symptomatic type 1 DM since  65 years of age after he was diagnosed with pancreatitis,  with most recent A1c of 7.8 %.   His diabetes is complicated by chronic pancreatitis  causing weight loss progressively from 305 to 196 pounds and patient remains at a high risk for more acute and chronic complications of diabetes which include CAD, CVA, CKD, retinopathy, and neuropathy. These are all discussed in detail with the patient.  - I have counseled the patient on diet management  by  adopting a carbohydrate restricted/protein rich diet.  - Suggestion is made for patient to avoid simple carbohydrates   from their diet including Cakes , Desserts, Ice Cream,  Soda (  diet and regular) , Sweet Tea , Candies,  Chips, Cookies, Artificial Sweeteners,   and "Sugar-free" Products . This will help patient to have stable blood glucose profile and potentially avoid unintended weight gain.  - I encouraged the patient to switch to  unprocessed or minimally processed complex starch and increased protein intake (animal or plant source), fruits, and vegetables.  - Patient is advised to stick to a routine mealtimes to eat 3 meals  a day and avoid unnecessary snacks ( to snack only to correct hypoglycemia).  - The patient will be scheduled with Norm Salt, RDN, CDE for individualized DM education.  - I have approached patient with the following individualized plan to manage diabetes and patient agrees:   - He came with better engagement and improved glucose profile. He still has significant postprandial hyperglycemia. He  Has avoided  hypoglycemia .  - I  will proceed with his basal insulin Lantus 20  units QHS, and   increase his prandial insulin NovoLog to 8 units TIDAC for pre-meal BG readings of 90-150mg /dl, plus patient specific correction dose for unexpected hyperglycemia above 150mg /dl, associated with strict monitoring of glucose  AC and HS. - Patient is warned not to take insulin without proper monitoring per orders. -Adjustment parameters are given for hypo and hyperglycemia in writing. -Patient is encouraged to call clinic for blood glucose levels less than 70 or above 300 mg /dl.   -Patient is not a candidate for  for metformin, SGLT2 inhibitors, incretin therapy. - Insulin is his exclusive choice to treat his diabetes.  - Given his chronic pancreatitis, he may need basal/bolus insulin therapy for life to control DIABETES. He may also need Creon to help with malabsorption if  he continues to lose weight.  - The main goal of therapy in him will be avoiding hypoglycemia due to the fact that he is at risk of hypoglycemia from loss of glucagon response due to chronic pancreatitis.   - Patient specific target  A1c;  LDL, HDL, Triglycerides, and  Waist Circumference were discussed in detail.  2) BP/HTN: controlled. Continue current medications including ARB. I refilled his Benicar 20 mg by mouth daily. 3) Lipids/HPL:  Controlled, LDL 65. He is not on statins.  4)  Weight/Diet: CDE Consult will be initiated , exercise, and detailed carbohydrates information provided.  5) Chronic Care/Health Maintenance:  -Patient is on ARB and  encouraged  to continue to follow up with Ophthalmology, Podiatrist at least yearly or according to recommendations, and advised to  Quit ( he has smoked heavily for 40+ years ) smoking. I have recommended yearly flu vaccine and pneumonia vaccination at least every 5 years; moderate intensity exercise for up to 150 minutes weekly; and  sleep for at least 7 hours a day.  - 25 minutes of time was spent on the care of this patient , 50% of which was applied for counseling on diabetes complications and their preventions.  - Patient to bring meter and  blood glucose logs during their next visit.   - I advised patient to maintain close follow up with PETERSON, Roselyn BeringSUSAN W, MD for primary care needs.  Follow up plan: - Return in about 3 months (around 05/23/2016) for follow up with pre-visit labs, meter, and logs.  Marquis LunchGebre Alyissa Whidbee, MD Phone: [phone]  Fax: [phone]   02/22/2016, 9:45 AM

## 2016-02-22 NOTE — Patient Instructions (Signed)

## 2016-04-18 ENCOUNTER — Ambulatory Visit (INDEPENDENT_AMBULATORY_CARE_PROVIDER_SITE_OTHER): Payer: BLUE CROSS/BLUE SHIELD | Admitting: *Deleted

## 2016-04-18 DIAGNOSIS — I442 Atrioventricular block, complete: Secondary | ICD-10-CM

## 2016-04-18 NOTE — Progress Notes (Signed)
Pacemaker check in clinic. Normal device function. Thresholds, sensing, impedances consistent with previous measurements. Device programmed to maximize longevity. 9 mode switches (<0.1%). No high ventricular rates noted. Device programmed at appropriate safety margins. Histogram distribution appropriate for patient activity level. Device programmed to optimize intrinsic conduction. Estimated longevity 1.5-4.579yrs. ROV with GT 10/2016. Patient education completed.

## 2016-05-03 ENCOUNTER — Other Ambulatory Visit: Payer: Self-pay

## 2016-05-03 MED ORDER — INSULIN GLARGINE 100 UNIT/ML SOLOSTAR PEN: 20.0000 [IU] | PEN_INJECTOR | Freq: Every day | SUBCUTANEOUS | 2 refills | Status: AC

## 2016-05-18 ENCOUNTER — Telehealth: Payer: Self-pay | Admitting: Internal Medicine

## 2016-05-18 NOTE — Telephone Encounter (Signed)
Request for surgical clearance:  1. What type of surgery is being performed? Endoscopy ultrasound   2. When is this surgery scheduled? Dec 19  3. Are there any medications that need to be held prior to surgery and how long?Dont Know   4. Name of physician performing surgery? Dr.Burbridge   5. What is your office phone and fax number? phne [phone] fax [phone]

## 2016-05-19 NOTE — Telephone Encounter (Signed)
Sent surgical clearance for endoscopy ultrasound to medical records to be faxed to [phone]. Dr. Ladona Ridgelaylor states - "low surgical risk".

## 2016-05-23 ENCOUNTER — Ambulatory Visit: Payer: BLUE CROSS/BLUE SHIELD | Admitting: "Endocrinology

## 2016-06-20 ENCOUNTER — Telehealth: Payer: Self-pay | Admitting: Internal Medicine

## 2016-06-20 ENCOUNTER — Ambulatory Visit: Payer: BLUE CROSS/BLUE SHIELD | Admitting: "Endocrinology

## 2016-06-20 NOTE — Telephone Encounter (Signed)
New Message    Request for surgical clearance:  1. What type of surgery is being performed? infusa port  2. When is this surgery scheduled? 06/21/16   3. Are there any medications that need to be held prior to surgery and how long? none  4. Name of physician performing surgery? Dr Carney CornersLahti  5. What is your office phone and fax number? [phone]  Fax [phone]   Pt has pace maker and they have questions on does it need reset after surgery?? Please call asap

## 2016-06-20 NOTE — Telephone Encounter (Signed)
Gabriel Sandoval is advised that Dr Ladona Ridgelaylor is not in the office today and that our DOD does not feel comfortable giving cardiac clearance/advisement on the pt as he has never seen the pt before. She verbalized understanding.  Will forward to Dr Ladona Ridgelaylor and his nurse, Renae.

## 2016-06-21 NOTE — Telephone Encounter (Signed)
Faxed to requesting office. 

## 2016-06-21 NOTE — Telephone Encounter (Signed)
May proceed with surgery. Low risk. GT

## 2016-09-04 DEATH — deceased
